# Patient Record
Sex: Female | Born: 1995 | Race: White | Hispanic: No | Marital: Single | Smoking: Never smoker
Health system: Southern US, Community
[De-identification: ages and names within clinical notes are randomized; demographics above are authoritative.]

## PROBLEM LIST (undated history)

## (undated) DIAGNOSIS — F32A Depression, unspecified: Secondary | ICD-10-CM

## (undated) DIAGNOSIS — F419 Anxiety disorder, unspecified: Secondary | ICD-10-CM

## (undated) DIAGNOSIS — K5190 Ulcerative colitis, unspecified, without complications: Secondary | ICD-10-CM

## (undated) DIAGNOSIS — Z975 Presence of (intrauterine) contraceptive device: Secondary | ICD-10-CM

## (undated) HISTORY — DX: Ulcerative colitis, unspecified, without complications: K51.90

## (undated) HISTORY — DX: Anxiety disorder, unspecified: F41.9

## (undated) HISTORY — DX: Presence of (intrauterine) contraceptive device: Z97.5

## (undated) HISTORY — DX: Depression, unspecified: F32.A

---
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
## 2013-06-02 DIAGNOSIS — K6289 Other specified diseases of anus and rectum: Secondary | ICD-10-CM | POA: Insufficient documentation

## 2020-02-07 ENCOUNTER — Other Ambulatory Visit (HOSPITAL_COMMUNITY): Payer: Self-pay

## 2020-04-15 ENCOUNTER — Ambulatory Visit: Payer: Self-pay | Admitting: Family Medicine

## 2020-04-15 ENCOUNTER — Other Ambulatory Visit: Payer: Self-pay

## 2020-04-15 ENCOUNTER — Ambulatory Visit (INDEPENDENT_AMBULATORY_CARE_PROVIDER_SITE_OTHER): Payer: No Typology Code available for payment source | Admitting: Family Medicine

## 2020-04-15 ENCOUNTER — Other Ambulatory Visit (HOSPITAL_COMMUNITY): Payer: Self-pay | Admitting: Family Medicine

## 2020-04-15 ENCOUNTER — Encounter: Payer: Self-pay | Admitting: Family Medicine

## 2020-04-15 VITALS — BP 110/80 | HR 88 | Temp 97.0°F | Ht 68.5 in | Wt 137.5 lb

## 2020-04-15 DIAGNOSIS — Z7189 Other specified counseling: Secondary | ICD-10-CM

## 2020-04-15 DIAGNOSIS — K5190 Ulcerative colitis, unspecified, without complications: Secondary | ICD-10-CM | POA: Diagnosis not present

## 2020-04-15 DIAGNOSIS — K51919 Ulcerative colitis, unspecified with unspecified complications: Secondary | ICD-10-CM

## 2020-04-15 DIAGNOSIS — F418 Other specified anxiety disorders: Secondary | ICD-10-CM

## 2020-04-15 MED ORDER — FLUOXETINE HCL 10 MG PO CAPS: 10.0000 mg | ORAL_CAPSULE | Freq: Every day | ORAL | 3 refills | Status: DC

## 2020-04-15 MED ORDER — MESALAMINE 1000 MG RE SUPP: RECTAL | 5 refills | Status: DC

## 2020-04-15 MED ORDER — FLUOXETINE HCL 20 MG PO TABS: 20.0000 mg | ORAL_TABLET | Freq: Every day | ORAL | 3 refills | Status: DC

## 2020-04-15 NOTE — Patient Instructions (Signed)
We'll request your records and call about seeing GI.  Take care.  Glad to see you. Thanks for your effort.

## 2020-04-15 NOTE — Progress Notes (Signed)
This visit occurred during the SARS-CoV-2 public health emergency.  Safety protocols were in place, including screening questions prior to the visit, additional usage of staff PPE, and extensive cleaning of exam room while observing appropriate contact time as indicated for disinfecting solutions.  New patient.    History of ulcerative colitis.  Had run out of mesalamine.  Last saw GI about 2 years ago.  Some night sweats, unclear if from SSRI or from UC.  She needed a refill on her prescriptions and need to get set back up with GI.  Referral placed.  Requesting old records.  She has had some fecal urgency and passing mucus recently.  Fortunately she is not having fevers.  She previously did better when she had her medication, meaning her symptoms were controlled on medication.  History of depression/anxiety.  She had been started on fluoxetine in the meantime and had clear improvement in her symptoms.  GAD of 9 and PHQ of 11 now.  Those are both significantly improved per her report.  Prior to starting treatment she had independently taken those assessments and had much higher scores.  No suicidal homicidal intent.  She very much wants to continue with Loxitane as it has been very beneficial.  Advance directive discussed with patient.  Both parents equally designated if patient were incapacitated.  She has history of copper T IUD placed.  Still with regular periods.  She had a Covid vaccine. She is up-to-date on tetanus and flu.  PMH and SH reviewed  ROS: Per HPI unless specifically indicated in ROS section   Meds, vitals, and allergies reviewed.   GEN: nad, alert and oriented HEENT: ncat NECK: supple w/o LA CV: rrr PULM: ctab, no inc wob ABD: soft, +bs EXT: no edema SKIN: no acute rash

## 2020-04-16 ENCOUNTER — Telehealth: Payer: Self-pay

## 2020-04-16 NOTE — Telephone Encounter (Signed)
Left message for patient to call back. Appointment scheduled to see Dr Elnoria Howard on 04/23/20 at 10:30 am.  [facility] [address] [place], [place] [address] [phone]

## 2020-04-17 ENCOUNTER — Encounter: Payer: Self-pay | Admitting: Family Medicine

## 2020-04-17 DIAGNOSIS — Z7189 Other specified counseling: Secondary | ICD-10-CM | POA: Insufficient documentation

## 2020-04-17 DIAGNOSIS — K5190 Ulcerative colitis, unspecified, without complications: Secondary | ICD-10-CM | POA: Insufficient documentation

## 2020-04-17 DIAGNOSIS — F418 Other specified anxiety disorders: Secondary | ICD-10-CM | POA: Insufficient documentation

## 2020-04-17 NOTE — Assessment & Plan Note (Signed)
Restart mesalamine suppository.  Prescription sent.  Refer back to GI.  Requesting old records.  She will update me if she is not improved in the meantime.

## 2020-04-17 NOTE — Assessment & Plan Note (Signed)
Advance directive discussed with patient.  Both parents equally designated if patient were incapacitated. 

## 2020-04-17 NOTE — Assessment & Plan Note (Signed)
No suicidal or homicidal intent.  She clearly improved on fluoxetine 30 mg a day.  Would continue 30 mg a day.  She will update me as needed.  She agrees with plan.

## 2020-04-18 NOTE — Telephone Encounter (Signed)
Left message for patient and her mom to have patient call me back about appointment

## 2020-04-22 NOTE — Telephone Encounter (Signed)
Called patient again and mailed letter. Called Dr Haywood Pao office and cancelled appointment for tomorrow so patient does not get a no show fee. Not sure if she is getting our messages. Mailed letter letting patient know that she can call their office to schedule directly when she is ready.

## 2020-04-24 ENCOUNTER — Telehealth: Payer: Self-pay

## 2020-04-24 MED ORDER — FLUOXETINE HCL 20 MG PO CAPS: ORAL_CAPSULE | ORAL | 3 refills | Status: DC

## 2020-04-24 NOTE — Telephone Encounter (Signed)
[facility] left v/m that on 04/15/20 fluoxetine 20 mg tabs # 90 x 3 was sent to [facility]. The co pay for fluoxetine 20 mg tabs for 90 day supply is $45.00 and the copay for fluoxetine 20 mg caps for 90 days is $15.00. pt was previously on caps. [facility] request new rx sent to pharmacy if approved by provider. Please advise.

## 2020-04-24 NOTE — Telephone Encounter (Signed)
rx updated to capsules and sent.  Thanks .

## 2020-08-12 ENCOUNTER — Emergency Department (HOSPITAL_COMMUNITY): Payer: No Typology Code available for payment source

## 2020-08-12 ENCOUNTER — Emergency Department (HOSPITAL_COMMUNITY)
Admission: EM | Admit: 2020-08-12 | Discharge: 2020-08-12 | Disposition: A | Discharge status: Home / Self Care | Payer: No Typology Code available for payment source | Attending: Emergency Medicine | Admitting: Emergency Medicine

## 2020-08-12 ENCOUNTER — Other Ambulatory Visit: Payer: Self-pay

## 2020-08-12 ENCOUNTER — Encounter (HOSPITAL_COMMUNITY): Payer: Self-pay | Admitting: Emergency Medicine

## 2020-08-12 DIAGNOSIS — R059 Cough, unspecified: Secondary | ICD-10-CM | POA: Diagnosis present

## 2020-08-12 DIAGNOSIS — J40 Bronchitis, not specified as acute or chronic: Secondary | ICD-10-CM | POA: Diagnosis not present

## 2020-08-12 MED ORDER — DOXYCYCLINE HYCLATE 100 MG PO TABS
100.0000 mg | ORAL_TABLET | Freq: Once | ORAL | Status: AC
  Administered 2020-08-12: 100 mg via ORAL
  Filled 2020-08-12: qty 1

## 2020-08-12 MED ORDER — DOXYCYCLINE HYCLATE 100 MG PO CAPS: 100.0000 mg | ORAL_CAPSULE | Freq: Two times a day (BID) | ORAL | 0 refills | Status: DC

## 2020-08-12 NOTE — ED Triage Notes (Signed)
Patient presents with cough, fatigue, sore throat. Intermittent fevers a few days ago. Productive cough with green sputum.

## 2020-08-12 NOTE — ED Provider Notes (Addendum)
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  She has been sick with URI symptoms for the past 9 days.  Specifically she has had nasal congestion, fevers, scratchy throat, general malaise, and productive cough.  She describes her productive cough has been productive of thick balls of mucus.  She denies being short of breath.  She has not had nausea, vomiting or diarrhea.  She has not had abdominal pain.  Symptoms are moderate.  She has been taking over-the-counter medications without adequate relief.  She tested negative for COVID 4 days ago.   Past Medical History:  Diagnosis Date  . Anxiety   . Depression   . IUD (intrauterine device) in place    Copper T  . Ulcerative colitis (HCC)     History reviewed. No pertinent surgical history.  Family History  Problem Relation Age of Onset  . Depression Father   . High blood pressure Father   . Breast cancer Paternal Grandmother   . Ulcerative colitis Neg Hx   . Colon cancer Neg Hx     Social History   Tobacco Use  . Smoking status: Never Smoker  . Smokeless tobacco: Never Used  Substance Use Topics  . Alcohol use: Yes    Comment: Rare alcohol use.  . Drug use: Never    Prior to Admission medications   Medication Sig Start Date End Date Taking? Authorizing Provider  doxycycline (VIBRAMYCIN) 100 MG capsule Take 1 capsule (100 mg total) by mouth 2 (two) times daily. One po bid x 7 days 08/12/20  Yes , , MD  FLUoxetine (PROZAC) 10 MG capsule Take 1 capsule (10 mg total) by mouth daily. With 20mg  for total 30mg  a day. 04/15/20   , MD  FLUoxetine (PROZAC) 10 MG capsule TAKE 3 CAPSULES BY MOUTH ONCE DAILY AS DIRECTED 02/07/20 02/06/21    FLUoxetine (PROZAC) 20 MG capsule Take 1  capsule (20 mg total) by mouth daily. With 10mg  for total 30mg  a day. 04/24/20   02/08/21, MD  mesalamine (CANASA) 1000 MG suppository UNWRAP AND INSERT ONE SUPPOSITORY RECTALLY AT BEDTIME AS DIRECTED 04/15/20   , MD  mesalamine (CANASA) 1000 MG suppository UNWRAP AND INSERT ONE SUPPOSITORY RECTALLY AT BEDTIME AS DIRECTED 04/15/20 04/15/21  04/17/20, MD    Allergies Patient has no known allergies.   REVIEW OF SYSTEMS  Negative except as noted here or in the History of Present Illness.   PHYSICAL EXAMINATION  Initial Vital Signs Blood pressure 122/88, pulse 95, temperature 98.7 F (37.1 C), temperature source Oral, resp. rate 20, height 5' 8.5" (1.74 m), weight 59 kg, last menstrual period 08/11/2020, SpO2 99 %.  Examination General: Well-developed, well-nourished female in no acute distress; appearance consistent with age of record HENT: normocephalic; atraumatic; dysphonia; no pharyngeal erythema or exudate Eyes: pupils equal, round and reactive to light; extraocular muscles intact Neck: supple Heart: regular rate and rhythm Lungs: clear to auscultation bilaterally Abdomen: soft; nondistended; nontender; no masses or hepatosplenomegaly; bowel sounds present Extremities: No deformity; full range of motion Neurologic: Awake, alert and oriented; motor function intact in all extremities and symmetric; no facial droop Skin: Warm and dry Psychiatric: Normal mood and affect   RESULTS  Summary of  this visit's results, reviewed and interpreted by myself:   EKG Interpretation  Date/Time:    Ventricular Rate:    PR Interval:    QRS Duration:   QT Interval:    QTC Calculation:   R Axis:     Text Interpretation:        Laboratory Studies: No results found for this or any previous visit (from the past 24 hour(s)). Imaging Studies: DG Chest 2 View  Result Date: 08/12/2020 CLINICAL DATA:  Two weeks of cough and intermittent fevers EXAM: CHEST - 2  VIEW COMPARISON:  None. FINDINGS: No consolidation, features of edema, pneumothorax, or effusion. Pulmonary vascularity is normally distributed. The cardiomediastinal contours are unremarkable. No acute osseous or soft tissue abnormality. IMPRESSION: No acute cardiopulmonary abnormality. Electronically Signed   By: Kreg Shropshire M.D.   On: 08/12/2020 06:00    ED COURSE and MDM  Nursing notes, initial and subsequent vitals signs, including pulse oximetry, reviewed and interpreted by myself.  Vitals:   08/12/20 0543 08/12/20 0544  BP:  122/88  Pulse:  95  Resp:  20  Temp:  98.7 F (37.1 C)  TempSrc:  Oral  SpO2:  99%  Weight: 59 kg   Height: 5' 8.5" (1.74 m)    Medications  doxycycline (VIBRA-TABS) tablet 100 mg (has no administration in time range)    Patient's chest x-ray shows no pneumonia but given the extended length of her illness I think it is reasonable to place her on a course of doxycycline.  She is on mesalamine for her ulcerative colitis but no immunosuppressive drugs.  Her lungs are clear and she does not wish an albuterol inhaler.  PROCEDURES  Procedures   ED DIAGNOSES     ICD-10-CM   1. Bronchitis  J40        , , MD 08/12/20 9937    Paula Libra, MD 08/12/20 [phone]

## 2020-11-10 ENCOUNTER — Other Ambulatory Visit (HOSPITAL_COMMUNITY): Payer: Self-pay

## 2020-11-10 MED FILL — Fluoxetine HCl Cap 10 MG: ORAL | 90 days supply | Qty: 270 | Fill #0 | Status: AC

## 2020-11-11 ENCOUNTER — Other Ambulatory Visit (HOSPITAL_COMMUNITY): Payer: Self-pay

## 2021-02-04 ENCOUNTER — Other Ambulatory Visit: Payer: Self-pay | Admitting: Family Medicine

## 2021-02-04 ENCOUNTER — Other Ambulatory Visit (HOSPITAL_COMMUNITY): Payer: Self-pay

## 2021-02-05 ENCOUNTER — Other Ambulatory Visit (HOSPITAL_COMMUNITY): Payer: Self-pay

## 2021-02-10 ENCOUNTER — Encounter: Payer: Self-pay | Admitting: Family Medicine

## 2021-02-10 ENCOUNTER — Other Ambulatory Visit: Payer: Self-pay

## 2021-02-10 ENCOUNTER — Other Ambulatory Visit (HOSPITAL_COMMUNITY): Payer: Self-pay

## 2021-02-10 ENCOUNTER — Ambulatory Visit (INDEPENDENT_AMBULATORY_CARE_PROVIDER_SITE_OTHER): Payer: No Typology Code available for payment source | Admitting: Family Medicine

## 2021-02-10 VITALS — BP 106/80 | HR 73 | Temp 98.2°F | Ht 68.5 in | Wt 145.0 lb

## 2021-02-10 DIAGNOSIS — F418 Other specified anxiety disorders: Secondary | ICD-10-CM

## 2021-02-10 DIAGNOSIS — Z23 Encounter for immunization: Secondary | ICD-10-CM

## 2021-02-10 DIAGNOSIS — K51919 Ulcerative colitis, unspecified with unspecified complications: Secondary | ICD-10-CM | POA: Diagnosis not present

## 2021-02-10 DIAGNOSIS — L237 Allergic contact dermatitis due to plants, except food: Secondary | ICD-10-CM

## 2021-02-10 MED ORDER — TRIAMCINOLONE ACETONIDE 0.5 % EX CREA
1.0000 "application " | TOPICAL_CREAM | Freq: Two times a day (BID) | CUTANEOUS | 1 refills | Status: DC | PRN
  Filled 2021-02-10: qty 30, 15d supply, fill #0

## 2021-02-10 MED ORDER — FLUOXETINE HCL 10 MG PO CAPS
ORAL_CAPSULE | ORAL | 3 refills | Status: DC
  Filled 2021-02-10: qty 270, 90d supply, fill #0
  Filled 2021-05-17: qty 270, 90d supply, fill #1
  Filled 2021-08-11: qty 270, 90d supply, fill #2
  Filled 2021-11-13: qty 270, 90d supply, fill #3

## 2021-02-10 MED ORDER — PREDNISONE 10 MG PO TABS: ORAL_TABLET | ORAL | 0 refills | Status: DC

## 2021-02-10 NOTE — Progress Notes (Signed)
This visit occurred during the SARS-CoV-2 public health emergency.  Safety protocols were in place, including screening questions prior to the visit, additional usage of staff PPE, and extensive cleaning of exam room while observing appropriate contact time as indicated for disinfecting solutions.  Mood f/u.  Mood is good, compliant with prozac, "it's like night and day."  It is worth continuing per patient report.  No SI/HI.  She has gone hiking for fun.  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  She will having weekends off after the job change.    Flu shot today.   D/w pt about referral to GI.  Referral placed.  Rare use of mesalamine.  No sx.  No blood in stool.    Rash.  Likely poison ivy exposure.  Itchy rash R forearm, present for about 1 weeks.  Used hydrocortisone.    Meds, vitals, and allergies reviewed.   ROS: Per HPI unless specifically indicated in ROS section   GEN: nad, alert and oriented HEENT: ncat NECK: supple w/o LA CV: rrr. PULM: ctab, no inc wob ABD: soft, +bs EXT: no edema SKIN: Blanching well demarcated pinkish rash noted on the right forearm in nondermatomal distribution.

## 2021-02-10 NOTE — Patient Instructions (Signed)
Use TAC cream if needed.  Then use prednisone if no relief.  Update me as needed.  We'll call about seeing GI.  Take care.  Glad to see you. Flu shot today.

## 2021-02-11 ENCOUNTER — Other Ambulatory Visit (HOSPITAL_COMMUNITY): Payer: Self-pay

## 2021-02-14 DIAGNOSIS — L237 Allergic contact dermatitis due to plants, except food: Secondary | ICD-10-CM | POA: Insufficient documentation

## 2021-02-14 NOTE — Assessment & Plan Note (Signed)
Refer to GI 

## 2021-02-14 NOTE — Assessment & Plan Note (Signed)
Mood is good, compliant with prozac, "it's like night and day."  It is worth continuing per patient report.  No SI/HI.  She has gone hiking for fun.  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  She will having weekends off after the job change.  Continue Prozac and she will update me as needed.

## 2021-02-14 NOTE — Assessment & Plan Note (Signed)
Hold prednisone for now.  Use triamcinolone cream initially and then use prednisone if needed.  Routine steroid cautions given to patient.

## 2021-03-03 ENCOUNTER — Other Ambulatory Visit (HOSPITAL_COMMUNITY): Payer: Self-pay

## 2021-03-03 MED ORDER — CARESTART COVID-19 HOME TEST VI KIT
PACK | 0 refills | Status: DC
  Filled 2021-03-03: qty 4, 4d supply, fill #0

## 2021-03-10 ENCOUNTER — Telehealth: Payer: Self-pay | Admitting: Family Medicine

## 2021-03-10 NOTE — Telephone Encounter (Signed)
Immunization record has been printed and emailed as requested. I called patient but there was no answer and VM not set up.

## 2021-03-10 NOTE — Telephone Encounter (Signed)
Pt called in stated she need's proof that she had her Flu shot ,wants to know if it can be emailed  [email or web address] . Please advise

## 2021-03-11 ENCOUNTER — Telehealth: Payer: Self-pay | Admitting: Family Medicine

## 2021-03-11 NOTE — Telephone Encounter (Signed)
Tried to call patient to verify if she received the email and if needs anything else but no answer and VM not set up.

## 2021-03-11 NOTE — Telephone Encounter (Signed)
Pt called asking for a scan of her flu shot and the date she got it and post it on her MyChart.

## 2021-05-17 ENCOUNTER — Other Ambulatory Visit (HOSPITAL_COMMUNITY): Payer: Self-pay

## 2021-08-12 ENCOUNTER — Other Ambulatory Visit (HOSPITAL_COMMUNITY): Payer: Self-pay

## 2021-11-13 ENCOUNTER — Other Ambulatory Visit (HOSPITAL_COMMUNITY): Payer: Self-pay

## 2022-01-05 ENCOUNTER — Telehealth: Payer: Self-pay | Admitting: Family Medicine

## 2022-01-05 ENCOUNTER — Other Ambulatory Visit (HOSPITAL_COMMUNITY): Payer: Self-pay

## 2022-01-05 MED ORDER — FLUOXETINE HCL 10 MG PO CAPS
30.0000 mg | ORAL_CAPSULE | Freq: Every day | ORAL | 0 refills | Status: DC
  Filled 2022-01-05: qty 270, 90d supply, fill #0

## 2022-01-05 NOTE — Telephone Encounter (Signed)
  Encourage patient to contact the pharmacy for refills or they can request refills through [facility]  Did the patient contact the pharmacy:  no   LAST APPOINTMENT DATE:  Please schedule appointment if longer than 1 year  NEXT APPOINTMENT DATE:10/172022  MEDICATION:FLUoxetine (PROZAC) 10 MG capsule  Is the patient out of medication? no   If not, how much is left?several weeks  Is this a 90 day supply: yes  PHARMACY: [facility] Phone:  [phone]  Fax:  [phone]      Let patient know to contact pharmacy at the end of the day to make sure medication is ready.  Please notify patient to allow 48-72 hours to process

## 2022-01-05 NOTE — Telephone Encounter (Signed)
Last refill 02/10/21 #270/3 Last office visit 02/10/21 No upcoming appointment scheduled

## 2022-01-05 NOTE — Telephone Encounter (Signed)
Sent. Thanks.   

## 2022-02-08 ENCOUNTER — Other Ambulatory Visit: Payer: Self-pay | Admitting: Family Medicine

## 2022-02-08 DIAGNOSIS — K51919 Ulcerative colitis, unspecified with unspecified complications: Secondary | ICD-10-CM

## 2022-02-09 ENCOUNTER — Other Ambulatory Visit: Payer: No Typology Code available for payment source

## 2022-02-10 ENCOUNTER — Other Ambulatory Visit (INDEPENDENT_AMBULATORY_CARE_PROVIDER_SITE_OTHER): Payer: No Typology Code available for payment source

## 2022-02-10 DIAGNOSIS — K51919 Ulcerative colitis, unspecified with unspecified complications: Secondary | ICD-10-CM

## 2022-02-10 LAB — COMPREHENSIVE METABOLIC PANEL
ALT: 8 U/L (ref 0–35)
AST: 12 U/L (ref 0–37)
Albumin: 4.3 g/dL (ref 3.5–5.2)
Alkaline Phosphatase: 45 U/L (ref 39–117)
BUN: 11 mg/dL (ref 6–23)
CO2: 29 mEq/L (ref 19–32)
Calcium: 8.8 mg/dL (ref 8.4–10.5)
Chloride: 103 mEq/L (ref 96–112)
Creatinine, Ser: 0.71 mg/dL (ref 0.40–1.20)
GFR: 115.94 mL/min (ref >60.00)
Glucose, Bld: 83 mg/dL (ref 70–99)
Potassium: 4.1 mEq/L (ref 3.5–5.1)
Sodium: 138 mEq/L (ref 135–145)
Total Bilirubin: 0.3 mg/dL (ref 0.2–1.2)
Total Protein: 7.1 g/dL (ref 6.0–8.3)

## 2022-02-10 LAB — CBC WITH DIFFERENTIAL/PLATELET
Basophils Absolute: 0.1 10*3/uL (ref 0.0–0.1)
Basophils Relative: 1.1 % (ref 0.0–3.0)
Eosinophils Absolute: 0.2 10*3/uL (ref 0.0–0.7)
Eosinophils Relative: 3.4 % (ref 0.0–5.0)
HCT: 36.6 % (ref 36.0–46.0)
Hemoglobin: 12.2 g/dL (ref 12.0–15.0)
Lymphocytes Relative: 39.1 % (ref 12.0–46.0)
Lymphs Abs: 2.5 10*3/uL (ref 0.7–4.0)
MCHC: 33.2 g/dL (ref 30.0–36.0)
MCV: 89.5 fl (ref 78.0–100.0)
Monocytes Absolute: 0.6 10*3/uL (ref 0.1–1.0)
Monocytes Relative: 8.9 % (ref 3.0–12.0)
Neutro Abs: 3.1 10*3/uL (ref 1.4–7.7)
Neutrophils Relative %: 47.5 % (ref 43.0–77.0)
Platelets: 325 10*3/uL (ref 150.0–400.0)
RBC: 4.09 Mil/uL (ref 3.87–5.11)
RDW: 13.6 % (ref 11.5–15.5)
WBC: 6.4 10*3/uL (ref 4.0–10.5)

## 2022-02-16 ENCOUNTER — Encounter: Payer: Self-pay | Admitting: Family Medicine

## 2022-02-16 ENCOUNTER — Ambulatory Visit (INDEPENDENT_AMBULATORY_CARE_PROVIDER_SITE_OTHER): Payer: No Typology Code available for payment source | Admitting: Family Medicine

## 2022-02-16 ENCOUNTER — Other Ambulatory Visit (HOSPITAL_COMMUNITY): Payer: Self-pay

## 2022-02-16 VITALS — BP 122/80 | HR 91 | Temp 97.8°F | Ht 68.5 in | Wt 153.0 lb

## 2022-02-16 DIAGNOSIS — Z975 Presence of (intrauterine) contraceptive device: Secondary | ICD-10-CM | POA: Diagnosis not present

## 2022-02-16 DIAGNOSIS — R61 Generalized hyperhidrosis: Secondary | ICD-10-CM

## 2022-02-16 DIAGNOSIS — Z0000 Encounter for general adult medical examination without abnormal findings: Secondary | ICD-10-CM | POA: Diagnosis not present

## 2022-02-16 DIAGNOSIS — R221 Localized swelling, mass and lump, neck: Secondary | ICD-10-CM

## 2022-02-16 DIAGNOSIS — K5190 Ulcerative colitis, unspecified, without complications: Secondary | ICD-10-CM

## 2022-02-16 DIAGNOSIS — F418 Other specified anxiety disorders: Secondary | ICD-10-CM

## 2022-02-16 DIAGNOSIS — Z7189 Other specified counseling: Secondary | ICD-10-CM

## 2022-02-16 LAB — TSH: TSH: 2.14 u[IU]/mL (ref 0.35–5.50)

## 2022-02-16 LAB — SEDIMENTATION RATE: Sed Rate: 19 mm/hr (ref 0–20)

## 2022-02-16 MED ORDER — FLUOXETINE HCL 10 MG PO CAPS
30.0000 mg | ORAL_CAPSULE | Freq: Every day | ORAL | 3 refills | Status: DC
  Filled 2022-02-16: qty 270, 90d supply, fill #0
  Filled 2022-05-28 – 2022-06-09 (×2): qty 270, 90d supply, fill #1
  Filled 2022-09-27: qty 270, 90d supply, fill #2
  Filled 2022-12-14: qty 270, 90d supply, fill #3

## 2022-02-16 NOTE — Progress Notes (Unsigned)
CPE- See plan.  Routine anticipatory guidance given to patient.  See health maintenance.  The possibility exists that previously documented standard health maintenance information may have been brought forward from a previous encounter into this note.  If needed, that same information has been updated to reflect the current situation based on today's encounter.    Tdap 21 Covid prev done.  Flu shot to be done at work.  PNA and shingles not due.  Refer to gyn 2023 Colon cancer screening not due.   Mammogram not due.  Advance directive discussed with patient.  Both parents equally designated if patient were incapacitated. [facility].  Would have HIV and HCV screening done.    Night sweats.  Not every night but a few nights a week.  Going on for about several years.  She has been putting up with it.  She isn't losing weight.  No cough, no sputum.  She is waking at night, sometimes from sweats but sometimes w/o sweats.  She can get back to sleep well.    Knot behind L jaw for a few years.  Noted in college.  Not ttp.  No other lumps.  Inferior to L ear.    She has IUD and wants to see gyn, d/w pt.  Referral placed.    Mood d/w pt.  Mostly good but some irritability.  No SI/HI.  Compliant with fluoxetine, it had helped in the past with depression.  She keeps snacks on hand to prevent prolonged fasting.   GI sx d/w pt. No FCNAV.  No blood in stool.  No diarrhea.  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    Working at the [place] center.  I thanked her for her effort   PMH and SH reviewed  Meds, vitals, and allergies reviewed.   ROS: Per HPI.  Unless specifically indicated otherwise in HPI, the patient denies:  General: fever. Eyes: acute vision changes ENT: sore throat Cardiovascular: chest pain Respiratory: SOB GI: vomiting GU: dysuria Musculoskeletal: acute back pain Derm: acute rash Neuro: acute motor dysfunction Psych: worsening mood Endocrine:  polydipsia Heme: bleeding Allergy: hayfever  GEN: nad, alert and oriented HEENT: mucous membranes moist NECK: supple w/o LA except for small asymmetry inferior to the L ear and anterior to the mastoid.  Not ttp.  No bruising or redness.  No other LA.   CV: rrr. PULM: ctab, no inc wob ABD: soft, +bs EXT: no edema SKIN: no acute rash

## 2022-02-16 NOTE — Patient Instructions (Addendum)
If you don't get a call about the referral in a week, then let me know.   Go to the lab on the way out.   If you have mychart we'll likely use that to update you.    Let me see about getting the ultrasound set up and we'll go from there.    Take care.  Glad to see you.

## 2022-02-18 ENCOUNTER — Other Ambulatory Visit: Payer: Self-pay | Admitting: Family Medicine

## 2022-02-18 DIAGNOSIS — R221 Localized swelling, mass and lump, neck: Secondary | ICD-10-CM | POA: Insufficient documentation

## 2022-02-18 DIAGNOSIS — Z0000 Encounter for general adult medical examination without abnormal findings: Secondary | ICD-10-CM | POA: Insufficient documentation

## 2022-02-18 DIAGNOSIS — R61 Generalized hyperhidrosis: Secondary | ICD-10-CM | POA: Insufficient documentation

## 2022-02-18 NOTE — Assessment & Plan Note (Signed)
Advance directive discussed with patient.  Both parents equally designated if patient were incapacitated.

## 2022-02-18 NOTE — Assessment & Plan Note (Signed)
Present for year, d/w pt about checking u/s.  Ordered.

## 2022-02-18 NOTE — Assessment & Plan Note (Signed)
Of unclear source, d/w pt about checking routine labs.  Unremarkable exam and no obvious cause.

## 2022-02-18 NOTE — Assessment & Plan Note (Signed)
Tdap 21 Covid prev done.  Flu shot to be done at work.  PNA and shingles not due.  Refer to gyn 2023 Colon cancer screening not due.   Mammogram not due.  Advance directive discussed with patient.  Both parents equally designated if patient were incapacitated. [facility]  Would have HIV and HCV screening done.

## 2022-02-18 NOTE — Assessment & Plan Note (Signed)
Would continue prozac and update me as needed.  She agrees.

## 2022-02-18 NOTE — Assessment & Plan Note (Signed)
Echo controlled as is, off treatment.

## 2022-02-21 LAB — QUANTIFERON-TB GOLD PLUS
Mitogen-NIL: 8.31 IU/mL
NIL: 0.02 IU/mL
QuantiFERON-TB Gold Plus: NEGATIVE
TB1-NIL: 0 IU/mL
TB2-NIL: 0 IU/mL

## 2022-03-22 IMAGING — CR DG CHEST 2V
2 series · 2 of 2 positions shown · non-contrast
Comparison: None.

CLINICAL DATA: Two weeks of cough and intermittent fevers

EXAM:
CHEST - 2 VIEW

[w chest pa]
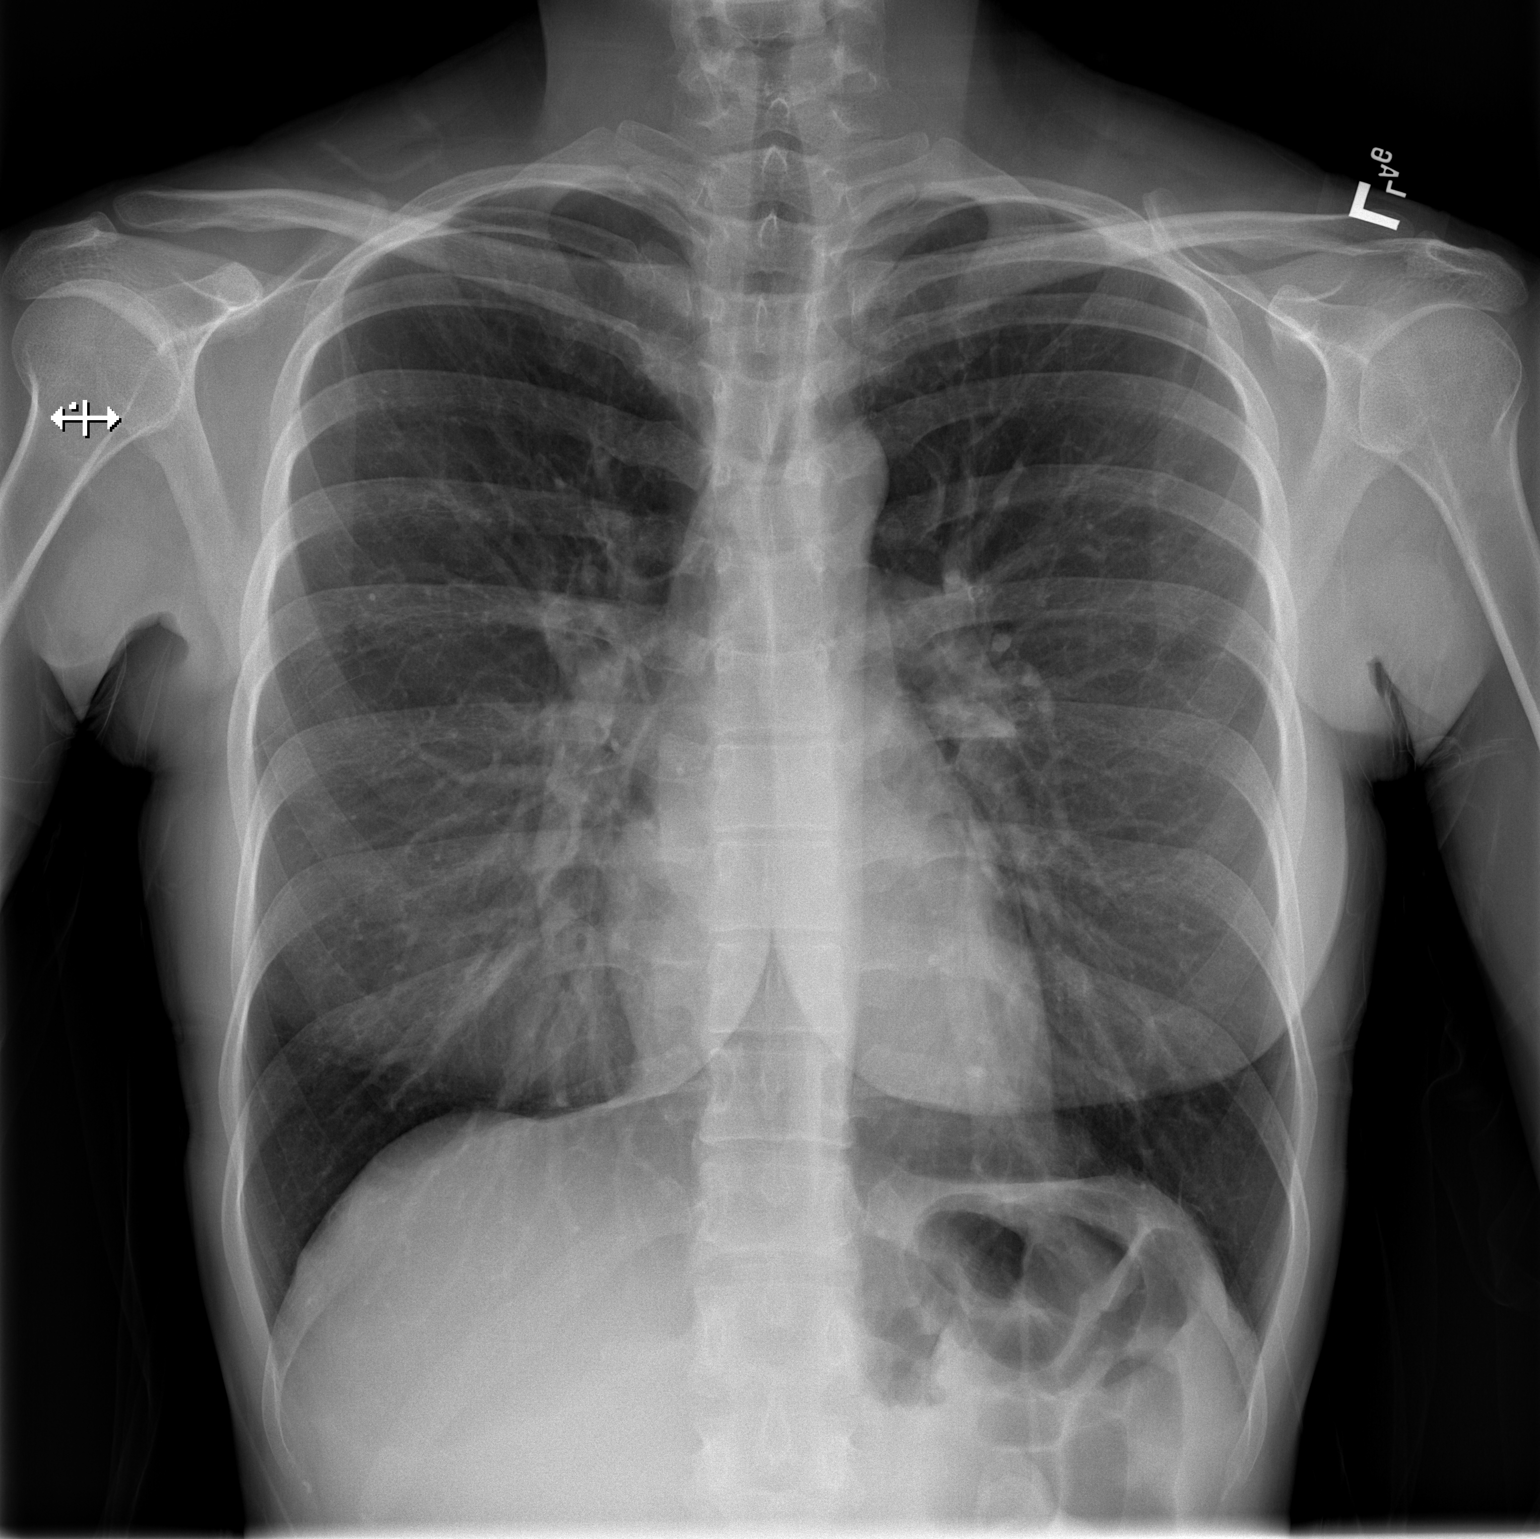

[w chest lat]
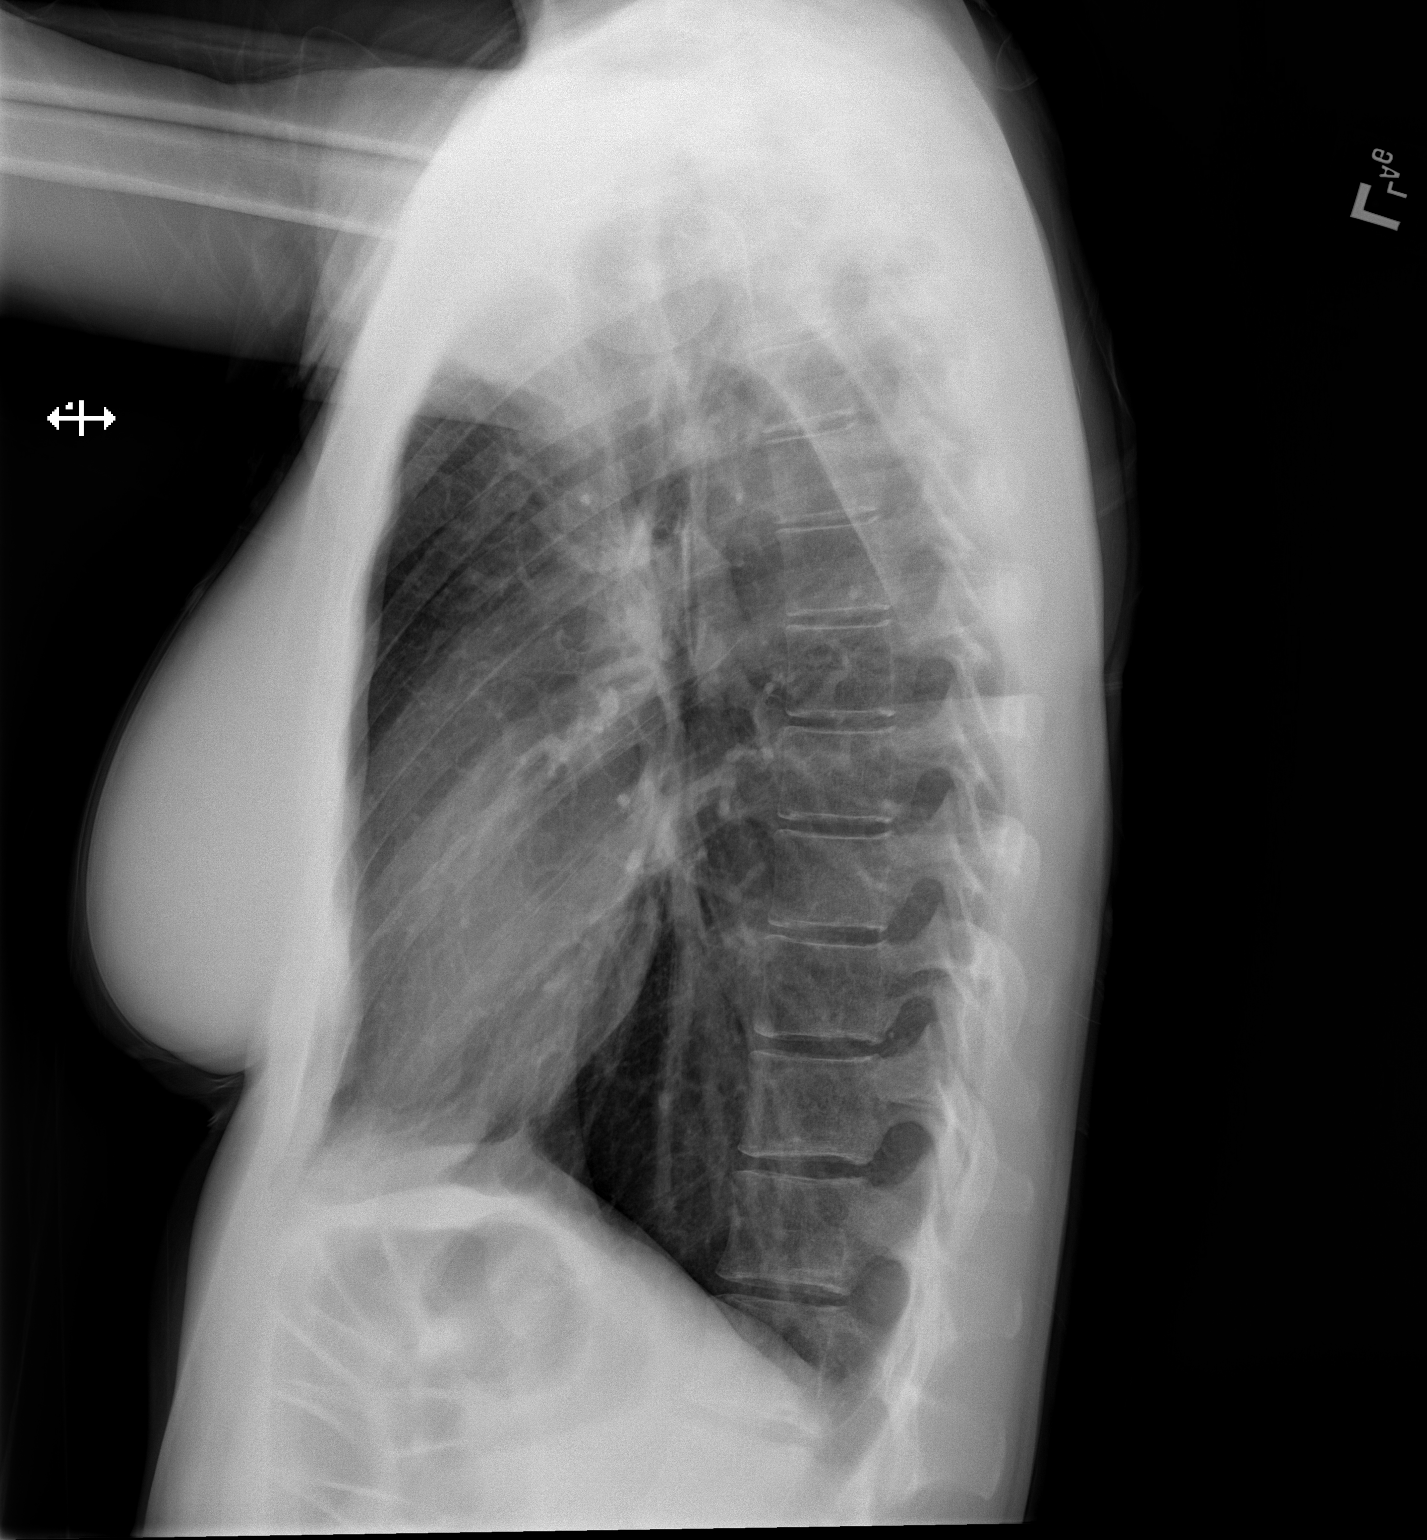

[2 of 2 positions shown; findings below may reference images not displayed]

FINDINGS: No consolidation, features of edema, pneumothorax, or effusion.
Pulmonary vascularity is normally distributed. The cardiomediastinal
contours are unremarkable. No acute osseous or soft tissue
abnormality.
IMPRESSION: No acute cardiopulmonary abnormality.

## 2022-03-26 ENCOUNTER — Ambulatory Visit
Admission: RE | Admit: 2022-03-26 | Discharge: 2022-03-26 | Disposition: A | Discharge status: Home / Self Care | Payer: No Typology Code available for payment source | Source: Ambulatory Visit | Attending: Family Medicine | Admitting: Family Medicine

## 2022-03-26 DIAGNOSIS — R221 Localized swelling, mass and lump, neck: Secondary | ICD-10-CM

## 2022-05-28 ENCOUNTER — Telehealth: Payer: 59 | Admitting: Emergency Medicine

## 2022-05-28 ENCOUNTER — Other Ambulatory Visit (HOSPITAL_COMMUNITY): Payer: Self-pay

## 2022-05-28 DIAGNOSIS — L237 Allergic contact dermatitis due to plants, except food: Secondary | ICD-10-CM | POA: Diagnosis not present

## 2022-05-28 MED ORDER — TRIAMCINOLONE ACETONIDE 0.1 % EX CREA
1.0000 | TOPICAL_CREAM | Freq: Two times a day (BID) | CUTANEOUS | 0 refills | Status: DC
  Filled 2022-05-28: qty 30, 15d supply, fill #0

## 2022-05-28 MED ORDER — PREDNISONE 10 MG PO TABS
ORAL_TABLET | ORAL | 0 refills | Status: DC
  Filled 2022-05-28: qty 37, 14d supply, fill #0

## 2022-05-28 NOTE — Progress Notes (Signed)
E-Visit for Poison Ivy  We are sorry that you are not feeing well.  Here is how we plan to help!  Based on what you have shared with me it looks like you have had an allergic reaction to the oily resin from a group of plants.  This resin is very sticky, so it easily attaches to your skin, clothing, tools equipment, and pet's fur.    This blistering rash is often called poison ivy rash although it can come from contact with the leaves, stems and roots of poison ivy, poison oak and poison sumac.  The oily resin contains urushiol (u-ROO-she-ol) that produces a skin rash on exposed skin.  The severity of the rash depends on the amount of urushiol that gets on your skin.  A section of skin with more urushiol on it may develop a rash sooner.  The rash usually develops 12-48 hours after exposure and can last two to three weeks.  Your skin must come in direct contact with the plant's oil to be affected.  Blister fluid doesn't spread the rash.  However, if you come into contact with a piece of clothing or pet fur that has urushiol on it, the rash may spread out.  You can also transfer the oil to other parts of your body with your fingers.  Often the rash looks like a straight line because of the way the plant brushes against your skin.  Since your rash is widespread or has resulted in a large number of blisters, I have prescribed an oral corticosteroid.  Please follow these recommendations:  I have sent a prednisone dose pack to your chosen pharmacy. Be sure to follow the instructions carefully and complete the entire prescription. You may use Benadryl or Caladryl topical lotions to sooth the itch and remember cool, not hot, showers and baths can help relieve the itching!  Place cool, wet compresses on the affected area for 15-30 minutes several times a day.  You may also take oral antihistamines, such as diphenhydramine (Benadryl, others), which may also help you sleep better.  Watch your skin for any purulent  (pus) drainage or red streaking from the site.  If this occurs, contact your provider.  You may require an antibiotic for a skin infection.  Make sure that the clothes you were wearing as well as any towels or sheets that may have come in contact with the oil (urushiol) are washed in detergent and hot water.       I have developed the following plan to treat your condition I am prescribing a two week course of steroids (37 tablets of 10 mg prednisone).  Days 1-4 take 4 tablets (40 mg) daily  Days 5-8 take 3 tablets (30 mg) daily, Days 9-11 take 2 tablets (20 mg) daily, Days 12-14 take 1 tablet (10 mg) daily.    What can you do to prevent this rash?  Avoid the plants.  Learn how to identify poison ivy, poison oak and poison sumac in all seasons.  When hiking or engaging in other activities that might expose you to these plants, try to stay on cleared pathways.  If camping, make sure you pitch your tent in an area free of these plants.  Keep pets from running through wooded areas so that urushiol doesn't accidentally stick to their fur, which you may touch.  Remove or kill the plants.  In your yard, you can get rid of poison ivy by applying an herbicide or pulling it out of   the ground, including the roots, while wearing heavy gloves.  Afterward remove the gloves and thoroughly wash them and your hands.  Don't burn poison ivy or related plants because the urushiol can be carried by smoke.  Wear protective clothing.  If needed, protect your skin by wearing socks, boots, pants, long sleeves and vinyl gloves.  Wash your skin right away.  Washing off the oil with soap and water within 30 minutes of exposure may reduce your chances of getting a poison ivy rash.  Even washing after an hour or so can help reduce the severity of the rash.  If you walk through some poison ivy and then later touch your shoes, you may get some urushiol on your hands, which may then transfer to your face or body by touching or  rubbing.  If the contaminated object isn't cleaned, the urushiol on it can still cause a skin reaction years later.    Be careful not to reuse towels after you have washed your skin.  Also carefully wash clothing in detergent and hot water to remove all traces of the oil.  Handle contaminated clothing carefully so you don't transfer the urushiol to yourself, furniture, rugs or appliances.  Remember that pets can carry the oil on their fur and paws.  If you think your pet may be contaminated with urushiol, put on some long rubber gloves and give your pet a bath.  Finally, be careful not to burn these plants as the smoke can contain traces of the oil.  Inhaling the smoke may result in difficulty breathing. If that occurred you should see a physician as soon as possible.  See your doctor right away if:  The reaction is severe or widespread You inhaled the smoke from burning poison ivy and are having difficulty breathing Your skin continues to swell The rash affects your eyes, mouth or genitals Blisters are oozing pus You develop a fever greater than 100 F (37.8 C) The rash doesn't get better within a few weeks.  If you scratch the poison ivy rash, bacteria under your fingernails may cause the skin to become infected.  See your doctor if pus starts oozing from the blisters.  Treatment generally includes antibiotics.  Poison ivy treatments are usually limited to self-care methods.  And the rash typically goes away on its own in two to three weeks.     If the rash is widespread or results in a large number of blisters, your doctor may prescribe an oral corticosteroid, such as prednisone.  If a bacterial infection has developed at the rash site, your doctor may give you a prescription for an oral antibiotic.  MAKE SURE YOU  Understand these instructions. Will watch your condition. Will get help right away if you are not doing well or get worse.   Thank you for choosing an e-visit.  Your  e-visit answers were reviewed by a board certified advanced clinical practitioner to complete your personal care plan. Depending upon the condition, your plan could have included both over the counter or prescription medications.  Please review your pharmacy choice. Make sure the pharmacy is open so you can pick up prescription now. If there is a problem, you may contact your provider through MyChart messaging and have the prescription routed to another pharmacy.  Your safety is important to us. If you have drug allergies check your prescription carefully.   For the next 24 hours you can use MyChart to ask questions about today's visit, request a non-urgent   call back, or ask for a work or school excuse. You will get an email in the next two days asking about your experience. I hope that your e-visit has been valuable and will speed your recovery.    I have spent 5 minutes in review of e-visit questionnaire, review and updating patient chart, medical decision making and response to patient.    , PhD, FNP-BC   

## 2022-06-05 ENCOUNTER — Other Ambulatory Visit (HOSPITAL_COMMUNITY): Payer: Self-pay

## 2022-06-09 ENCOUNTER — Other Ambulatory Visit (HOSPITAL_COMMUNITY): Payer: Self-pay

## 2022-10-02 ENCOUNTER — Encounter: Payer: Self-pay | Admitting: Family Medicine

## 2022-10-04 ENCOUNTER — Telehealth: Payer: Self-pay | Admitting: Family Medicine

## 2022-10-04 ENCOUNTER — Other Ambulatory Visit: Payer: Self-pay | Admitting: Family Medicine

## 2022-10-04 DIAGNOSIS — K5190 Ulcerative colitis, unspecified, without complications: Secondary | ICD-10-CM

## 2022-10-04 DIAGNOSIS — K51919 Ulcerative colitis, unspecified with unspecified complications: Secondary | ICD-10-CM

## 2022-10-04 NOTE — Telephone Encounter (Signed)
Please triage patient about her GI symptoms.  Thanks.

## 2022-10-05 ENCOUNTER — Other Ambulatory Visit: Payer: Self-pay | Admitting: Family Medicine

## 2022-10-05 ENCOUNTER — Encounter (HOSPITAL_COMMUNITY): Payer: Self-pay | Admitting: Radiology

## 2022-10-05 DIAGNOSIS — K51919 Ulcerative colitis, unspecified with unspecified complications: Secondary | ICD-10-CM

## 2022-10-05 NOTE — Telephone Encounter (Signed)
Sending note to Dr Para March; also please see 10/02/22 pt message about referral./

## 2022-10-05 NOTE — Telephone Encounter (Signed)
Can her GI referral get moved to a stat referral?  When can she be seen by GI?  If her symptoms are getting worse in the meantime, then let me know.  Thanks.

## 2022-10-05 NOTE — Telephone Encounter (Signed)
Patient returned call to the office, sent patient to access nurse. Please advise, thank you

## 2022-10-05 NOTE — Telephone Encounter (Signed)
Unable to reach pt by phone and left v/m requesting cb [phone]. Sending note to [facility] triage.

## 2022-10-06 NOTE — Telephone Encounter (Signed)
LMTCB

## 2022-10-06 NOTE — Telephone Encounter (Signed)
In order for GI to see patient quicker then a normal schedule the referral has to be put in as urgent. Otherwise they will schedule the patient at the next open appt which could be months away. Referral per coordinator needs to be changed to urgent as stated below.

## 2022-10-06 NOTE — Telephone Encounter (Signed)
Spoke with patient and advised her that an urgent GI referral has been placed and that if she gets worse and can't see them to make an OV here. Patient verbalized understanding and stated that she is stable right now.

## 2022-10-06 NOTE — Telephone Encounter (Signed)
Please check with patient.  If she is worsening in the meantime or if she can't be seen soon by GI, then please see about getting her an interval OV here.  Thanks.

## 2022-10-06 NOTE — Telephone Encounter (Signed)
I put in the new referral.  Please check with patient.  Make sure that if she is getting worse in the meantime or if she still can't be seen by GI soon, then please see about getting her an interval OV here. Thanks.

## 2022-10-07 ENCOUNTER — Encounter: Payer: Self-pay | Admitting: *Deleted

## 2022-10-21 ENCOUNTER — Other Ambulatory Visit (HOSPITAL_COMMUNITY): Payer: Self-pay

## 2022-10-21 ENCOUNTER — Other Ambulatory Visit (HOSPITAL_BASED_OUTPATIENT_CLINIC_OR_DEPARTMENT_OTHER): Payer: Self-pay

## 2022-10-21 DIAGNOSIS — K625 Hemorrhage of anus and rectum: Secondary | ICD-10-CM | POA: Diagnosis not present

## 2022-10-21 DIAGNOSIS — K5190 Ulcerative colitis, unspecified, without complications: Secondary | ICD-10-CM | POA: Diagnosis not present

## 2022-10-21 MED ORDER — CLENPIQ 10-3.5-12 MG-GM -GM/175ML PO SOLN
ORAL | 0 refills | Status: DC
  Filled 2022-10-21: qty 350, 1d supply, fill #0

## 2022-10-22 ENCOUNTER — Other Ambulatory Visit (HOSPITAL_COMMUNITY): Payer: Self-pay

## 2022-10-23 DIAGNOSIS — K5190 Ulcerative colitis, unspecified, without complications: Secondary | ICD-10-CM | POA: Diagnosis not present

## 2022-11-05 DIAGNOSIS — K259 Gastric ulcer, unspecified as acute or chronic, without hemorrhage or perforation: Secondary | ICD-10-CM | POA: Diagnosis not present

## 2022-11-05 DIAGNOSIS — K635 Polyp of colon: Secondary | ICD-10-CM | POA: Diagnosis not present

## 2022-11-05 DIAGNOSIS — K625 Hemorrhage of anus and rectum: Secondary | ICD-10-CM | POA: Diagnosis not present

## 2022-11-05 DIAGNOSIS — K2289 Other specified disease of esophagus: Secondary | ICD-10-CM | POA: Diagnosis not present

## 2022-11-05 DIAGNOSIS — R12 Heartburn: Secondary | ICD-10-CM | POA: Diagnosis not present

## 2022-11-05 DIAGNOSIS — K449 Diaphragmatic hernia without obstruction or gangrene: Secondary | ICD-10-CM | POA: Diagnosis not present

## 2022-11-05 DIAGNOSIS — K5140 Inflammatory polyps of colon without complications: Secondary | ICD-10-CM | POA: Diagnosis not present

## 2022-11-05 DIAGNOSIS — K51311 Ulcerative (chronic) rectosigmoiditis with rectal bleeding: Secondary | ICD-10-CM | POA: Diagnosis not present

## 2022-11-05 DIAGNOSIS — R197 Diarrhea, unspecified: Secondary | ICD-10-CM | POA: Diagnosis not present

## 2022-11-05 DIAGNOSIS — K5190 Ulcerative colitis, unspecified, without complications: Secondary | ICD-10-CM | POA: Diagnosis not present

## 2022-11-05 LAB — HM COLONOSCOPY

## 2022-11-06 ENCOUNTER — Other Ambulatory Visit (HOSPITAL_COMMUNITY): Payer: Self-pay

## 2022-11-06 MED ORDER — PREDNISONE 10 MG PO TABS
10.0000 mg | ORAL_TABLET | Freq: Every day | ORAL | 3 refills | Status: DC
  Filled 2022-11-06: qty 120, 30d supply, fill #0
  Filled 2022-12-14: qty 120, 30d supply, fill #1

## 2022-11-09 DIAGNOSIS — K5190 Ulcerative colitis, unspecified, without complications: Secondary | ICD-10-CM | POA: Diagnosis not present

## 2022-11-18 ENCOUNTER — Encounter: Payer: Self-pay | Admitting: Family Medicine

## 2022-11-26 ENCOUNTER — Other Ambulatory Visit (HOSPITAL_COMMUNITY): Payer: Self-pay

## 2022-12-02 ENCOUNTER — Other Ambulatory Visit: Payer: Self-pay

## 2022-12-02 ENCOUNTER — Other Ambulatory Visit (HOSPITAL_COMMUNITY): Payer: Self-pay

## 2022-12-02 ENCOUNTER — Ambulatory Visit: Payer: Commercial Managed Care - PPO | Admitting: Pharmacist

## 2022-12-02 DIAGNOSIS — Z7189 Other specified counseling: Secondary | ICD-10-CM

## 2022-12-02 MED ORDER — HUMIRA (2 PEN) 40 MG/0.8ML ~~LOC~~ AJKT: AUTO-INJECTOR | SUBCUTANEOUS | 11 refills | Status: DC

## 2022-12-02 MED ORDER — HUMIRA (2 PEN) 80 MG/0.8ML ~~LOC~~ PNKT
PEN_INJECTOR | SUBCUTANEOUS | 0 refills | Status: DC
  Filled 2022-12-02: qty 3, 15d supply, fill #0

## 2022-12-02 MED ORDER — HUMIRA (2 PEN) 40 MG/0.4ML ~~LOC~~ AJKT
AUTO-INJECTOR | SUBCUTANEOUS | 11 refills | Status: DC
  Filled 2022-12-02: qty 2, fill #0

## 2022-12-02 MED ORDER — HUMIRA (2 PEN) 40 MG/0.4ML ~~LOC~~ AJKT: AUTO-INJECTOR | SUBCUTANEOUS | 11 refills | Status: DC

## 2022-12-02 MED ORDER — HUMIRA (2 PEN) 40 MG/0.8ML ~~LOC~~ AJKT: AUTO-INJECTOR | SUBCUTANEOUS | 0 refills | Status: DC

## 2022-12-02 MED ORDER — HUMIRA (2 PEN) 80 MG/0.8ML ~~LOC~~ PNKT
PEN_INJECTOR | SUBCUTANEOUS | 0 refills | Status: DC
  Filled 2022-12-02: qty 3, fill #0

## 2022-12-02 NOTE — Progress Notes (Signed)
  S: Patient presents for review of their specialty medication therapy.  Patient is about to start taking Humira for UC. Patient is managed by Dr. Elnoria Howard  for this.   Adherence: has not started   Efficacy: has not started  Dosing:  Ulcerative colitis: SubQ (may continue aminosalicylates and/or corticosteroids; if necessary, azathioprine, or mercaptopurine may also be continued): Initial: 160 mg (given as four 40 mg injections on day 1 or given as two 40 mg injections per day over 2 consecutive days), then 80 mg 2 weeks later (day 15). Maintenance: 40 mg every other week beginning day 29. Note: Only continue maintenance dose in patients demonstrating clinical remission by 8 weeks (day 57) of therapy.  Dose adjustments: Renal: no dose adjustments (has not been studied) Hepatic: no dose adjustments (has not been studied)  Drug-drug interactions: none identified  Screening: TB test: negative, 02/16/22 Hepatitis: completed   Monitoring: S/sx of infection: none CBC: nl 02/16/22 S/sx of hypersensitivity: none S/sx of malignancy: none S/sx of heart failure: none  Other side effects: none  O:     Lab Results  Component Value Date   WBC 6.4 02/10/2022   HGB 12.2 02/10/2022   HCT 36.6 02/10/2022   MCV 89.5 02/10/2022   PLT 325.0 02/10/2022      Chemistry      Component Value Date/Time   NA 138 02/10/2022 0834   K 4.1 02/10/2022 0834   CL 103 02/10/2022 0834   CO2 29 02/10/2022 0834   BUN 11 02/10/2022 0834   CREATININE 0.71 02/10/2022 0834      Component Value Date/Time   CALCIUM 8.8 02/10/2022 0834   ALKPHOS 45 02/10/2022 0834   AST 12 02/10/2022 0834   ALT 8 02/10/2022 0834   BILITOT 0.3 02/10/2022 0834       A/P: 1. Medication review: Patient is about to start Humira for UC. Reviewed the medication with the patient, including the following: Humira is a TNF blocking agent indicated for ankylosing spondylitis, Crohn's disease, Hidradenitis suppurativa, psoriatic  arthritis, plaque psoriasis, ulcerative colitis, and uveitis. Patient educated on purpose, proper use and potential adverse effects of Humira. Possible adverse effects are increased risk of infections, headache, and injection site reactions. There is the possibility of an increased risk of malignancy but it is not well understood if this increased risk is due to there medication or the disease state. There are rare cases of pancytopenia and aplastic anemia. For SubQ injection at separate sites in the thigh or lower abdomen (avoiding areas within 2 inches of navel); rotate injection sites. May leave at room temperature for ~15 to 30 minutes prior to use; do not remove cap or cover while allowing product to reach room temperature. Do not use if solution is discolored or contains particulate matter. Do not administer to skin which is red, tender, bruised, hard, or that has scars, stretch marks, or psoriasis plaques. Needle cap of the prefilled syringe or needle cover for the adalimumab pen may contain latex. Prefilled pens and syringes are available for use by patients and the full amount of the syringe should be injected (self-administration); the vial is intended for institutional use only. Vials do not contain a preservative; discard unused portion. No recommendations for any changes at this time.  Butch Penny, PharmD, P[place]y [place], CPP Clinical Pharmacist [facility] [phone]

## 2022-12-03 ENCOUNTER — Other Ambulatory Visit: Payer: Self-pay | Admitting: Pharmacist

## 2022-12-03 ENCOUNTER — Other Ambulatory Visit (HOSPITAL_COMMUNITY): Payer: Self-pay

## 2022-12-03 ENCOUNTER — Other Ambulatory Visit: Payer: Self-pay

## 2022-12-03 MED ORDER — HUMIRA (2 PEN) 40 MG/0.8ML ~~LOC~~ AJKT
AUTO-INJECTOR | SUBCUTANEOUS | 0 refills | Status: DC
  Filled 2022-12-03: qty 6, 15d supply, fill #0
  Filled 2022-12-03: qty 6, fill #0
  Filled 2022-12-04: qty 6, 28d supply, fill #0

## 2022-12-03 MED ORDER — HUMIRA (2 PEN) 40 MG/0.8ML ~~LOC~~ AJKT
AUTO-INJECTOR | SUBCUTANEOUS | 11 refills | Status: DC
  Filled 2022-12-03: qty 2, fill #0
  Filled 2022-12-21: qty 2, 28d supply, fill #0
  Filled 2023-01-20: qty 2, 28d supply, fill #1
  Filled 2023-02-18: qty 2, 28d supply, fill #2
  Filled 2023-03-18: qty 2, 28d supply, fill #3
  Filled 2023-04-15 – 2023-04-16 (×2): qty 2, 28d supply, fill #4
  Filled 2023-05-11 – 2023-05-18 (×2): qty 2, 28d supply, fill #5
  Filled 2023-06-09: qty 2, 28d supply, fill #6
  Filled 2023-07-12 (×2): qty 2, 28d supply, fill #7
  Filled 2023-08-10 (×2): qty 2, 28d supply, fill #8
  Filled 2023-09-06: qty 2, 28d supply, fill #9
  Filled 2023-10-04: qty 2, 28d supply, fill #10
  Filled 2023-10-26: qty 2, 28d supply, fill #11

## 2022-12-03 MED ORDER — HUMIRA (2 PEN) 40 MG/0.8ML ~~LOC~~ AJKT
AUTO-INJECTOR | SUBCUTANEOUS | 0 refills | Status: DC
  Filled 2022-12-03: qty 6, fill #0

## 2022-12-04 ENCOUNTER — Other Ambulatory Visit: Payer: Self-pay

## 2022-12-04 ENCOUNTER — Other Ambulatory Visit (HOSPITAL_COMMUNITY): Payer: Self-pay

## 2022-12-15 ENCOUNTER — Other Ambulatory Visit (HOSPITAL_COMMUNITY): Payer: Self-pay

## 2022-12-21 ENCOUNTER — Other Ambulatory Visit (HOSPITAL_COMMUNITY): Payer: Self-pay

## 2022-12-21 ENCOUNTER — Other Ambulatory Visit: Payer: Self-pay

## 2022-12-25 ENCOUNTER — Other Ambulatory Visit (HOSPITAL_COMMUNITY): Payer: Self-pay

## 2023-01-01 ENCOUNTER — Other Ambulatory Visit (HOSPITAL_COMMUNITY): Payer: Self-pay

## 2023-01-05 ENCOUNTER — Other Ambulatory Visit: Payer: Self-pay

## 2023-01-13 DIAGNOSIS — K5190 Ulcerative colitis, unspecified, without complications: Secondary | ICD-10-CM | POA: Diagnosis not present

## 2023-01-16 ENCOUNTER — Encounter (HOSPITAL_COMMUNITY): Payer: Self-pay

## 2023-01-19 ENCOUNTER — Other Ambulatory Visit: Payer: Self-pay

## 2023-01-20 ENCOUNTER — Other Ambulatory Visit: Payer: Self-pay

## 2023-01-20 ENCOUNTER — Other Ambulatory Visit (HOSPITAL_COMMUNITY): Payer: Self-pay | Admitting: Pharmacy Technician

## 2023-01-20 NOTE — Progress Notes (Signed)
Specialty Pharmacy Refill Coordination Note  Tracy Phelps is a 27 y.o. female contacted today regarding refills of specialty medication(s) Adalimumab .  Patient requested [name] at [facility] at [place]  on 01/26/23   Medication will be filled on 01/25/23.

## 2023-01-25 ENCOUNTER — Other Ambulatory Visit (HOSPITAL_COMMUNITY): Payer: Self-pay

## 2023-01-25 ENCOUNTER — Other Ambulatory Visit: Payer: Self-pay

## 2023-02-17 ENCOUNTER — Encounter: Payer: Self-pay | Admitting: Family Medicine

## 2023-02-17 ENCOUNTER — Other Ambulatory Visit: Payer: Self-pay | Admitting: Family Medicine

## 2023-02-17 DIAGNOSIS — Z975 Presence of (intrauterine) contraceptive device: Secondary | ICD-10-CM

## 2023-02-18 ENCOUNTER — Other Ambulatory Visit: Payer: Self-pay

## 2023-02-18 ENCOUNTER — Other Ambulatory Visit (HOSPITAL_COMMUNITY): Payer: Self-pay

## 2023-02-18 NOTE — Progress Notes (Signed)
Specialty Pharmacy Refill Coordination Note  Tracy Phelps is a 27 y.o. female contacted today regarding refills of specialty medication(s) Adalimumab   Patient requested [name] at [facility] at [place] date: 02/22/23   Medication will be filled on 02/22/23.

## 2023-02-24 ENCOUNTER — Other Ambulatory Visit (HOSPITAL_COMMUNITY): Payer: Self-pay

## 2023-03-18 ENCOUNTER — Other Ambulatory Visit: Payer: Self-pay

## 2023-03-18 ENCOUNTER — Other Ambulatory Visit (HOSPITAL_COMMUNITY): Payer: Self-pay

## 2023-03-18 NOTE — Progress Notes (Signed)
Specialty Pharmacy Refill Coordination Note  Zenja Hassler is a 27 y.o. female contacted today regarding refills of specialty medication(s) Adalimumab   Patient requested [name] at [facility] at [place] date: 03/23/23   Medication will be filled on 03/22/23.

## 2023-03-22 ENCOUNTER — Other Ambulatory Visit: Payer: Self-pay | Admitting: Family Medicine

## 2023-03-22 ENCOUNTER — Other Ambulatory Visit: Payer: Self-pay

## 2023-03-23 ENCOUNTER — Other Ambulatory Visit (HOSPITAL_COMMUNITY): Payer: Self-pay

## 2023-03-23 NOTE — Telephone Encounter (Signed)
Last office visit: 02/16/22 Next office visit: nothing scheduled Last refill:  FLUoxetine (PROZAC) 10 MG capsule 02/16/22 270 capsules with 3 refills

## 2023-03-24 ENCOUNTER — Other Ambulatory Visit (HOSPITAL_COMMUNITY): Payer: Self-pay

## 2023-03-24 MED ORDER — FLUOXETINE HCL 10 MG PO CAPS
30.0000 mg | ORAL_CAPSULE | Freq: Every day | ORAL | 1 refills | Status: DC
  Filled 2023-03-24: qty 270, 90d supply, fill #0
  Filled 2023-06-23: qty 270, 90d supply, fill #1

## 2023-04-15 ENCOUNTER — Other Ambulatory Visit: Payer: Self-pay

## 2023-04-16 ENCOUNTER — Other Ambulatory Visit: Payer: Self-pay

## 2023-04-16 NOTE — Progress Notes (Signed)
Specialty Pharmacy Refill Coordination Note  Tracy Phelps is a 27 y.o. female contacted today regarding refills of specialty medication(s) Adalimumab (Humira (2 Pen))   Patient requested Pickup at [facility] at [facility] date: 04/20/23   Medication will be filled on 04/19/23.

## 2023-04-19 ENCOUNTER — Other Ambulatory Visit: Payer: Self-pay

## 2023-05-06 ENCOUNTER — Encounter: Payer: Self-pay | Admitting: Family Medicine

## 2023-05-06 DIAGNOSIS — R221 Localized swelling, mass and lump, neck: Secondary | ICD-10-CM

## 2023-05-11 ENCOUNTER — Other Ambulatory Visit (HOSPITAL_COMMUNITY): Payer: Self-pay

## 2023-05-11 ENCOUNTER — Other Ambulatory Visit (HOSPITAL_COMMUNITY): Payer: Self-pay | Admitting: Pharmacy Technician

## 2023-05-11 NOTE — Progress Notes (Signed)
 Specialty Pharmacy Refill Coordination Note  Tracy Phelps is a 28 y.o. female contacted today regarding refills of specialty medication(s) Adalimumab  (Humira  (2 Pen))   Patient requested Pickup at [facility] at [place] date: 05/20/23   Medication will be filled on 05/19/23.

## 2023-05-17 ENCOUNTER — Ambulatory Visit: Payer: Self-pay | Admitting: Family Medicine

## 2023-05-17 NOTE — Telephone Encounter (Signed)
Copied from CRM [id number]. Topic: Clinical - Red Word Triage >> May 17, 2023 10:16 AM Tracy Phelps wrote: Reason for CRM: Sharp pain in chest when breathing in or sneezing   Chief Complaint: Chest Pain (Inspiration) Symptoms: No other symptoms.  Frequency: Acute, Yesterday Pertinent Negatives: Patient denies shortness of breath, dull, crushing, or aching chest pain.  Disposition: [] ED /[] Urgent Care (no appt availability in office) / [x] Appointment(In office/virtual)/ []  [place] [facility]/ [] Home Care/ [] Refused Recommended Disposition /[] [place] [facility]/ []  Follow-up with PCP Additional Notes: Tracy Phelps is a 28 year old female triaged today for 'sharp,' chest pain on inspiration. The patient reports occurrences with exertion and deep inspiration only. No pain occurs at rest and the pain only lasts for a few seconds. No radiation, no cardiac history, no respiratory history, and no current symptoms. The patient reports the symptoms started yesterday after playing with her family member outside. History of recent, severe RSV infection. No cough for a week per the patient's report. No evident signs of distress at this time. Advised the patient if symptoms were to change or worsen to go to the ER. Appointment made in office for tomorrow morning. Patient agreed and verbalized understanding to all information and education during telephonic triage.     Reason for Disposition  [1] Chest pain lasts < 5 minutes AND [2] NO chest pain or cardiac symptoms (e.g., breathing difficulty, sweating) now  (Exception: Chest pains that last only a few seconds.)  Answer Assessment - Initial Assessment Questions 1. LOCATION: "Where does it hurt?"       Right under the left breast, rib area and right.  2. RADIATION: "Does the pain go anywhere else?" (e.g., into neck, jaw, arms, back)     No radiation  3. ONSET: "When did the chest pain begin?" (Minutes, hours or days)      Since Yesterday  4. PATTERN: "Does  the pain come and go, or has it been constant since it started?"  "Does it get worse with exertion?"      Intermittent  5. DURATION: "How long does it last" (e.g., seconds, minutes, hours)     Seconds with occurrence  6. SEVERITY: "How bad is the pain?"  (e.g., Scale 1-10; mild, moderate, or severe)    - MILD (1-3): doesn't interfere with normal activities     - MODERATE (4-7): interferes with normal activities or awakens from sleep    - SEVERE (8-10): excruciating pain, unable to do any normal activities       5  7. CARDIAC RISK FACTORS: "Do you have any history of heart problems or risk factors for heart disease?" (e.g., angina, prior heart attack; diabetes, high blood pressure, high cholesterol, smoker, or strong family history of heart disease)     No  8. PULMONARY RISK FACTORS: "Do you have any history of lung disease?"  (e.g., blood clots in lung, asthma, emphysema, birth control pills)     No  9. CAUSE: "What do you think is causing the chest pain?"     Unsure  10. OTHER SYMPTOMS: "Do you have any other symptoms?" (e.g., dizziness, nausea, vomiting, sweating, fever, difficulty breathing, cough)       None  11. PREGNANCY: "Is there any chance you are pregnant?" "When was your last menstrual period?"       No, 05-02-2023  Protocols used: Chest Pain-A-AH

## 2023-05-17 NOTE — Telephone Encounter (Signed)
Agree. Thanks

## 2023-05-18 ENCOUNTER — Other Ambulatory Visit: Payer: Self-pay

## 2023-05-18 ENCOUNTER — Other Ambulatory Visit (HOSPITAL_COMMUNITY): Payer: Self-pay

## 2023-05-18 ENCOUNTER — Encounter: Payer: Self-pay | Admitting: Family Medicine

## 2023-05-18 ENCOUNTER — Ambulatory Visit: Payer: Commercial Managed Care - PPO | Admitting: Family Medicine

## 2023-05-18 VITALS — BP 100/72 | HR 77 | Temp 98.4°F | Ht 67.5 in | Wt 169.2 lb

## 2023-05-18 DIAGNOSIS — D849 Immunodeficiency, unspecified: Secondary | ICD-10-CM | POA: Insufficient documentation

## 2023-05-18 DIAGNOSIS — R0781 Pleurodynia: Secondary | ICD-10-CM | POA: Diagnosis not present

## 2023-05-18 MED ORDER — PREDNISONE 20 MG PO TABS
ORAL_TABLET | ORAL | 0 refills | Status: AC
  Filled 2023-05-18: qty 15, 7d supply, fill #0

## 2023-05-18 NOTE — Progress Notes (Signed)
Patient ID: Tracy Phelps, female    DOB: 02/24/1996, 28 y.o.   MRN: [id number]  This visit was conducted in person.  BP 100/72 (BP Location: Left Arm, Patient Position: Sitting, Cuff Size: Normal)   Pulse 77   Temp 98.4 F (36.9 C) (Temporal)   Ht 5' 7.5" (1.715 m)   Wt 169 lb 4 oz (76.8 kg)   LMP 05/02/2023   SpO2 99%   BMI 26.12 kg/m    CC:  Chief Complaint  Patient presents with   Bilateral Rib Pain    Subjective:   HPI: Tracy Phelps is a 28 y.o. female patient of Dr. Lianne Bushy with history of ulcerative colitis presenting on 05/18/2023 for Bilateral Rib Pain    New onset pain in bilateral rib cage x 4 days: describes as sharp pain on deep  inspiration.... bilateral lower rib.  Notes also with coughing and sneezing..  Now at rest feels some lower rib tightness.  Sharp lasts a few seconds at a time.  Started after playing with a family member.    Recent history of viral  infection. ( [place] for  COVID) RSV going atround office. With this had left sided chest wall pain, sharp.. improved.  No cough in last week.  No SOB, no wheeze.   No fever.  No fatigue.    Has take tylenol, ibuprofen 200 mg.. no relief.  No history of asthma or lung disorder.   Has had some relief with icing.   On Humira for ulcerative colitis.  Relevant past medical, surgical, family and social history reviewed and updated as indicated. Interim medical history since our last visit reviewed. Allergies and medications reviewed and updated. Outpatient Medications Prior to Visit  Medication Sig Dispense Refill   adalimumab (HUMIRA, 2 PEN,) 40 MG/0.8ML PNKT pen Use as directed subcutaneously inject 160 mg on day 1, 80 mg on day 15, then 40 mg beginning on day 29 (days supply: 15 days) 6 each 0   FLUoxetine (PROZAC) 10 MG capsule Take 3 capsules (30 mg total) by mouth daily as directed 270 capsule 1   adalimumab (HUMIRA, 2 PEN,) 40 MG/0.8ML AJKT pen Inject as directed  subcutaneously every other week 28 days 2 each 11   predniSONE (DELTASONE) 10 MG tablet Days 1 - 4 Take 4 tablets by mouth daily--  Days 5 - 8 Take 3 tablets daily-- Days 9 - 11 Take 2 tablets daily-- Days 12 - 14 Take 1 tablet daily 37 tablet 0   predniSONE (DELTASONE) 10 MG tablet Take 4 tablets by mouth once a day 120 tablet 3   Sod Picosulfate-Mag Ox-Cit Acd (CLENPIQ) 10-3.5-12 MG-GM -GM/175ML SOLN Use as directed 350 mL 0   triamcinolone cream (KENALOG) 0.1 % Apply topically 2 times daily. 30 g 0   No facility-administered medications prior to visit.     Per HPI unless specifically indicated in ROS section below Review of Systems  Constitutional:  Negative for fatigue and fever.  HENT:  Negative for congestion.   Eyes:  Negative for pain.  Respiratory:  Negative for cough and shortness of breath.   Cardiovascular:  Negative for chest pain, palpitations and leg swelling.  Gastrointestinal:  Negative for abdominal pain.  Genitourinary:  Negative for dysuria and vaginal bleeding.  Musculoskeletal:  Negative for back pain.  Neurological:  Negative for syncope, light-headedness and headaches.  Psychiatric/Behavioral:  Negative for dysphoric mood.    Objective:  BP 100/72 (BP Location: Left Arm, Patient Position: Sitting,  Cuff Size: Normal)   Pulse 77   Temp 98.4 F (36.9 C) (Temporal)   Ht 5' 7.5" (1.715 m)   Wt 169 lb 4 oz (76.8 kg)   LMP 05/02/2023   SpO2 99%   BMI 26.12 kg/m   Wt Readings from Last 3 Encounters:  05/18/23 169 lb 4 oz (76.8 kg)  02/16/22 153 lb (69.4 kg)  02/10/21 145 lb (65.8 kg)      Physical Exam Constitutional:      General: She is not in acute distress.    Appearance: Normal appearance. She is well-developed. She is not ill-appearing or toxic-appearing.  HENT:     Head: Normocephalic.     Right Ear: Hearing, tympanic membrane, ear canal and external ear normal. Tympanic membrane is not erythematous, retracted or bulging.     Left Ear: Hearing,  tympanic membrane, ear canal and external ear normal. Tympanic membrane is not erythematous, retracted or bulging.     Nose: No mucosal edema or rhinorrhea.     Right Sinus: No maxillary sinus tenderness or frontal sinus tenderness.     Left Sinus: No maxillary sinus tenderness or frontal sinus tenderness.     Mouth/Throat:     Pharynx: Uvula midline.  Eyes:     General: Lids are normal. Lids are everted, no foreign bodies appreciated.     Conjunctiva/sclera: Conjunctivae normal.     Pupils: Pupils are equal, round, and reactive to light.  Neck:     Thyroid: No thyroid mass or thyromegaly.     Vascular: No carotid bruit.     Trachea: Trachea normal.  Cardiovascular:     Rate and Rhythm: Normal rate and regular rhythm.     Pulses: Normal pulses.     Heart sounds: Normal heart sounds, S1 normal and S2 normal. No murmur heard.    No friction rub. No gallop.  Pulmonary:     Effort: Pulmonary effort is normal. No tachypnea or respiratory distress.     Breath sounds: Normal breath sounds. No decreased breath sounds, wheezing, rhonchi or rales.  Chest:     Chest wall: Tenderness present.    Abdominal:     General: Bowel sounds are normal.     Palpations: Abdomen is soft.     Tenderness: There is no abdominal tenderness.  Musculoskeletal:     Cervical back: Normal range of motion and neck supple.  Skin:    General: Skin is warm and dry.     Findings: No rash.  Neurological:     Mental Status: She is alert.  Psychiatric:        Mood and Affect: Mood is not anxious or depressed.        Speech: Speech normal.        Behavior: Behavior normal. Behavior is cooperative.        Thought Content: Thought content normal.        Judgment: Judgment normal.       Results for orders placed or performed in visit on 11/18/22  [facility] COLONOSCOPY   Collection Time: 11/05/22 12:50 PM  Result Value Ref Range   [facility] Colonoscopy See Report (in chart) See Report (in chart), Patient Reported     Assessment and Plan  Pleuritic chest pain Assessment & Plan: Acute, chest wall muscle strain versus pleurisy versus fracture rib.  Less likely pericarditis.  Normal lung exam and no sign of underlying current bacterial infection. No sign and symptom on exam and pain not localized to upper  anterior chest: Pericarditis unlikely Patient exposed to RSV but not tested.  RSV not typically associated with pleurisy. Patient with preceding coughing fits, excessive now likely with chest wall strain given increased activity triggering.  Recommend prednisone taper, heat or ice and gentle chest wall stretching. No red flags or indication for x-ray.  Return and ER precautions provided.   Immunocompromised (HCC) Assessment & Plan: Due to Humira   Other orders -     predniSONE; Take 3 tabs by mouth daily x 3 days, then 2 tabs by mouth daily x 2 days then 1 tab by mouth daily x 2 days  Dispense: 15 tablet; Refill: 0    No follow-ups on file.   Kerby Nora, MD

## 2023-05-18 NOTE — Assessment & Plan Note (Signed)
Due to Humira 

## 2023-05-18 NOTE — Assessment & Plan Note (Signed)
Acute, chest wall muscle strain versus pleurisy versus fracture rib.  Less likely pericarditis.  Normal lung exam and no sign of underlying current bacterial infection. No sign and symptom on exam and pain not localized to upper anterior chest: Pericarditis unlikely Patient exposed to RSV but not tested.  RSV not typically associated with pleurisy. Patient with preceding coughing fits, excessive now likely with chest wall strain given increased activity triggering.  Recommend prednisone taper, heat or ice and gentle chest wall stretching. No red flags or indication for x-ray.  Return and ER precautions provided.

## 2023-05-19 ENCOUNTER — Other Ambulatory Visit: Payer: Self-pay

## 2023-05-20 ENCOUNTER — Other Ambulatory Visit (HOSPITAL_COMMUNITY): Payer: Self-pay

## 2023-05-20 DIAGNOSIS — N898 Other specified noninflammatory disorders of vagina: Secondary | ICD-10-CM | POA: Diagnosis not present

## 2023-05-20 DIAGNOSIS — Z113 Encounter for screening for infections with a predominantly sexual mode of transmission: Secondary | ICD-10-CM | POA: Diagnosis not present

## 2023-05-20 MED ORDER — FLUCONAZOLE 150 MG PO TABS
150.0000 mg | ORAL_TABLET | ORAL | 0 refills | Status: AC
  Filled 2023-05-20: qty 2, 2d supply, fill #0

## 2023-05-20 MED ORDER — METRONIDAZOLE 500 MG PO TABS
ORAL_TABLET | ORAL | 0 refills | Status: AC
  Filled 2023-05-20: qty 14, 7d supply, fill #0

## 2023-06-03 ENCOUNTER — Ambulatory Visit (INDEPENDENT_AMBULATORY_CARE_PROVIDER_SITE_OTHER): Payer: Commercial Managed Care - PPO | Admitting: Otolaryngology

## 2023-06-03 ENCOUNTER — Encounter (INDEPENDENT_AMBULATORY_CARE_PROVIDER_SITE_OTHER): Payer: Self-pay | Admitting: Otolaryngology

## 2023-06-03 VITALS — BP 132/90 | HR 91 | Ht 68.5 in | Wt 165.0 lb

## 2023-06-03 DIAGNOSIS — T701XXA Sinus barotrauma, initial encounter: Secondary | ICD-10-CM | POA: Diagnosis not present

## 2023-06-03 DIAGNOSIS — H6992 Unspecified Eustachian tube disorder, left ear: Secondary | ICD-10-CM

## 2023-06-03 NOTE — Progress Notes (Signed)
 ENT CONSULT:  Reason for Consult: lump near left ear x 10 yrs    HPI: Discussed the use of AI scribe software for clinical note transcription with the patient, who gave verbal consent to proceed.  History of Present Illness   Tracy Phelps is a 28 year old female who presents with a lump near left ear lobule.   She has had a lump behind her left ear for approximately ten years. The lump swells and shrinks over time, is generally painless, but occasionally feels slightly tender. It has been evaluated by different doctors, but no interventions have been pursued. An ultrasound performed in 2023 was reported as negative. There have been no infection or drainage around the site.  She experiences significant sinus pain on the left side when flying, described as ear pain that feels like her ear is going to explode. This pain occurs intermittently and not every time she flies. Attempts to relieve the pain by trying to pop her ears have been ineffective. No history of allergies, postnasal drainage, itchy or watery eyes is reported.  She reports experiencing night sweats that come and go, which she thinks might be hormonally related. She has eczema but no other hx of rashes. No weight changes. No lumps anywhere else.   She has a history of sebaceous cysts on her head, with a few still present. Her last complete blood count (CBC) was normal in 2023.      Records Reviewed:     office visit with PCP Dr Cleatus 02/16/22 Tdap 21 Covid prev done.  Flu shot to be done at work.  PNA and shingles not due.  Refer to gyn 2023 Colon cancer screening not due.   Mammogram not due.  Advance directive discussed with patient.  Both parents equally designated if patient were incapacitated. [facility].  Would have HIV and HCV screening done.     Night sweats.  Not every night but a few nights a week.  Going on for about several years.  She has been putting up with it.  She  isn't losing weight.  No cough, no sputum.  She is waking at night, sometimes from sweats but sometimes w/o sweats.  She can get back to sleep well.     Knot behind L jaw for a few years.  Noted in college.  Not ttp.  No other lumps.  Inferior to L ear.    Past Medical History:  Diagnosis Date   Anxiety    Depression    IUD (intrauterine device) in place    Copper  T   Ulcerative colitis (HCC)     History reviewed. No pertinent surgical history.  Family History  Problem Relation Age of Onset   Depression Father    High blood pressure Father    Breast cancer Paternal Grandmother    Ulcerative colitis Neg Hx    Colon cancer Neg Hx     Social History:  reports that she has never smoked. She has never used smokeless tobacco. She reports current alcohol use. She reports that she does not use drugs.  Allergies: No Known Allergies  Medications: I have reviewed the patient's current medications.  The PMH, PSH, Medications, Allergies, and SH were reviewed and updated.  ROS: Constitutional: Negative for fever, weight loss and weight gain. Cardiovascular: Negative for chest pain and dyspnea on exertion. Respiratory: Is not experiencing shortness of breath at rest. Gastrointestinal: Negative for nausea and vomiting. Neurological: Negative for  headaches. Psychiatric: The patient is not nervous/anxious  Blood pressure (!) 132/90, pulse 91, height 5' 8.5 (1.74 m), weight 165 lb (74.8 kg), last menstrual period 05/02/2023, SpO2 99%.  PHYSICAL EXAM:  Exam: General: Well-developed, well-nourished Respiratory Respiratory effort: Equal inspiration and expiration without stridor Cardiovascular Peripheral Vascular: Warm extremities with equal color/perfusion Eyes: No nystagmus with equal extraocular motion bilaterally Neuro/Psych/Balance: Patient oriented to person, place, and time; Appropriate mood and affect; Gait is intact with no imbalance; Cranial nerves I-XII are intact Head and  Face Inspection: Normocephalic and atraumatic without mass or lesion Palpation: Facial skeleton intact without bony stepoffs Salivary Glands: No mass or tenderness Facial Strength: Facial motility symmetric and full bilaterally ENT Pinna: External ear intact and fully developed External canal: Canal is patent with intact skin Tympanic Membrane: Clear and mobile External Nose: No scar or anatomic deformity Internal Nose: Septum is relatively straight on anterior rhinoscopy Lips, Teeth, and gums: Mucosa and teeth intact and viable TMJ: No pain to palpation with full mobility Oral cavity/oropharynx: No erythema or exudate, no lesions present, 1+ tonsils Neck Neck and Trachea: Midline trachea without mass or lesion Thyroid : No mass or nodularity Lymphatics: No lymphadenopathy No palpable cysts or lumps in the area of concern   Procedure: none  Studies Reviewed: CBC w/diff - 02/10/2022 - normal   Neck U/S 03/26/22  FINDINGS: No discrete mass is visualized in the imaged area of concern. The closest correlate may be the left parotid gland. No abnormal blood flow.   IMPRESSION: No discrete mass is visualized in the imaged area of concern. The closest correlate is the left parotid gland.  Assessment/Plan: Encounter Diagnoses  Name Primary?   Dysfunction of left eustachian tube Yes   Aerosinusitis, initial encounter    Assessment and Plan    Sensation of a small lump near left ear lobule  Subjective sensation of a small knot near left ear lobule and angle of the mandible,  present for approximately ten years. Painless, occasionally tender, with no concerning findings from previous evaluation (2023 ultrasound was negative). CBC w diff 2023 was normal. Differential diagnosis includes benign anatomical prominence such as a styloid process or muscle tendon. No palpable masses on exam today. No signs of infection, drainage, or significant changes in size over the course of 10 yrs.  Explained that CT scan and biopsy are unnecessary unless changes occur. Reassured her of the benign nature of her sx. - No further diagnostic imaging or biopsy unless changes occur   Sinus Pain with Air Travel Aerosinusitis  Intermittent sinus pain localized to the left ear/face during air travel, likely due to eustachian tube dysfunction and pressure differential/aerosinusitis. No history of allergies, postnasal drainage, or other sinus-related symptoms. Symptoms managed conservatively. Discussed Afrin nasal spray use for one to two days before flying to alleviate symptoms, with caution against habitual use to prevent rebound congestion. - Recommend Afrin nasal spray for one to two days before flying - Advise against habitual use of Afrin to prevent rebound congestion   Thank you for allowing me to participate in the care of this patient. Please do not hesitate to contact me with any questions or concerns.   Elena Larry, MD Otolaryngology [facility] Phone: [phone] Fax: [phone]    06/03/2023, 9:23 AM

## 2023-06-04 ENCOUNTER — Encounter: Payer: Self-pay | Admitting: Family Medicine

## 2023-06-09 ENCOUNTER — Other Ambulatory Visit: Payer: Self-pay

## 2023-06-09 ENCOUNTER — Encounter (HOSPITAL_COMMUNITY): Payer: Self-pay

## 2023-06-09 NOTE — Progress Notes (Signed)
Specialty Pharmacy Ongoing Clinical Assessment Note  Tracy Phelps is a 28 y.o. female who is being followed by the specialty pharmacy service for RxSp Ulcerative Colitis   Patient's specialty medication(s) reviewed today: Adalimumab (Humira (2 Pen))   Missed doses in the last 4 weeks: 0   Patient/Caregiver did not have any additional questions or concerns.   Therapeutic benefit summary: Patient is achieving benefit   Adverse events/side effects summary: No adverse events/side effects   Patient's therapy is appropriate to: Continue    Goals Addressed             This Visit's Progress    Minimize recurrence of flares       Patient is on track. Patient will maintain adherence         Follow up:  6 months  Bobette Mo Specialty Pharmacist

## 2023-06-09 NOTE — Progress Notes (Signed)
Specialty Pharmacy Refill Coordination Note  Tracy Phelps is a 28 y.o. female contacted today regarding refills of specialty medication(s) Adalimumab (Humira (2 Pen))   Patient requested Pickup at [facility] at [facility] date: 06/16/23   Medication will be filled on 06/15/23. Pending PA. Patient aware.

## 2023-06-10 ENCOUNTER — Other Ambulatory Visit (HOSPITAL_COMMUNITY): Payer: Self-pay

## 2023-06-10 ENCOUNTER — Other Ambulatory Visit: Payer: Self-pay

## 2023-06-10 NOTE — Progress Notes (Signed)
Pharmacy Patient Advocate Encounter   Received notification from Patient Pharmacy that prior authorization for Humira is required/requested.   Insurance verification completed.   The patient is insured through [facility] .   Per test claim: PA required; PA submitted to above mentioned insurance via CoverMyMeds Key/confirmation #/EOC [id number] Status is pending

## 2023-06-11 ENCOUNTER — Other Ambulatory Visit: Payer: Self-pay

## 2023-06-11 NOTE — Progress Notes (Addendum)
Pharmacy Patient Advocate Encounter  Received notification from [facility] that Prior Authorization for Humira has been APPROVED from 06/10/23 to 06/08/24   PA #/Case ID/Reference #: [id number]

## 2023-06-14 ENCOUNTER — Other Ambulatory Visit: Payer: Self-pay | Admitting: Family Medicine

## 2023-06-14 DIAGNOSIS — R221 Localized swelling, mass and lump, neck: Secondary | ICD-10-CM

## 2023-06-15 ENCOUNTER — Other Ambulatory Visit: Payer: Self-pay

## 2023-06-15 ENCOUNTER — Encounter: Payer: Self-pay | Admitting: *Deleted

## 2023-06-17 ENCOUNTER — Other Ambulatory Visit (HOSPITAL_COMMUNITY): Payer: Self-pay

## 2023-06-29 ENCOUNTER — Other Ambulatory Visit (HOSPITAL_BASED_OUTPATIENT_CLINIC_OR_DEPARTMENT_OTHER): Payer: Self-pay

## 2023-06-29 ENCOUNTER — Other Ambulatory Visit (HOSPITAL_COMMUNITY): Payer: Self-pay

## 2023-07-12 ENCOUNTER — Other Ambulatory Visit: Payer: Self-pay

## 2023-07-12 ENCOUNTER — Other Ambulatory Visit (HOSPITAL_COMMUNITY): Payer: Self-pay

## 2023-07-12 NOTE — Progress Notes (Signed)
 Specialty Pharmacy Refill Coordination Note  Tracy Phelps is a 28 y.o. female contacted today regarding refills of specialty medication(s) Adalimumab (Humira (2 Pen))   Patient requested Pickup at [facility] at [facility] date: 07/14/23   Medication will be filled on 07/13/23.

## 2023-08-03 ENCOUNTER — Encounter: Payer: Self-pay | Admitting: Obstetrics & Gynecology

## 2023-08-03 ENCOUNTER — Other Ambulatory Visit (HOSPITAL_COMMUNITY)
Admission: RE | Admit: 2023-08-03 | Discharge: 2023-08-03 | Disposition: A | Discharge status: Home / Self Care | Source: Ambulatory Visit | Attending: Obstetrics & Gynecology | Admitting: Obstetrics & Gynecology

## 2023-08-03 ENCOUNTER — Ambulatory Visit: Payer: Commercial Managed Care - PPO | Admitting: Obstetrics & Gynecology

## 2023-08-03 VITALS — BP 125/86 | HR 68 | Wt 174.0 lb

## 2023-08-03 DIAGNOSIS — Z30433 Encounter for removal and reinsertion of intrauterine contraceptive device: Secondary | ICD-10-CM | POA: Diagnosis not present

## 2023-08-03 DIAGNOSIS — Z01419 Encounter for gynecological examination (general) (routine) without abnormal findings: Secondary | ICD-10-CM

## 2023-08-03 DIAGNOSIS — Z975 Presence of (intrauterine) contraceptive device: Secondary | ICD-10-CM | POA: Insufficient documentation

## 2023-08-03 MED ORDER — PARAGARD INTRAUTERINE COPPER IU IUD
1.0000 | INTRAUTERINE_SYSTEM | Freq: Once | INTRAUTERINE | Status: AC
  Administered 2023-08-03: 1 via INTRAUTERINE

## 2023-08-03 NOTE — Progress Notes (Signed)
 GYNECOLOGY ANNUAL PREVENTATIVE CARE ENCOUNTER NOTE  History:     Tracy Phelps is a 28 y.o. G0P0000 female here to establish care and  for a routine annual gynecologic exam.  Also wants replacement of her Paragard, has been in place for over 10 years.   Denies abnormal vaginal bleeding, discharge, pelvic pain, problems with intercourse or other gynecologic concerns.  She is here with her support person.    Gynecologic History Patient's last menstrual period was 07/27/2023 (approximate). Contraception: Paragard IUD Last Pap: Never had one.   Obstetric History OB History  Gravida Para Term Preterm AB Living  0 0 0 0 0 0  SAB IAB Ectopic Multiple Live Births  0 0 0 0 0    Past Medical History:  Diagnosis Date   Anxiety    Depression    IUD (intrauterine device) in place    Copper T   Ulcerative colitis (HCC)     History reviewed. No pertinent surgical history.  Current Outpatient Medications on File Prior to Visit  Medication Sig Dispense Refill   adalimumab (HUMIRA, 2 PEN,) 40 MG/0.8ML PNKT pen Use as directed subcutaneously inject 160 mg on day 1, 80 mg on day 15, then 40 mg beginning on day 29 (days supply: 15 days) 6 each 0   FLUoxetine (PROZAC) 10 MG capsule Take 3 capsules (30 mg total) by mouth daily as directed 270 capsule 1   adalimumab (HUMIRA, 2 PEN,) 40 MG/0.8ML AJKT pen Inject as directed subcutaneously every other week 28 days (Patient not taking: Reported on 08/03/2023) 2 each 11   fluconazole (DIFLUCAN) 150 MG tablet Take 1 tablet (150 mg total) by mouth, wait 3 days then take 2nd tablet (Patient not taking: Reported on 08/03/2023) 2 tablet 0   metroNIDAZOLE (FLAGYL) 500 MG tablet Take 1 tablet (500 mg total) by mouth in the morning and 1 tablet (500 mg total) in the evening. Do all this for 7 days. (Patient not taking: Reported on 08/03/2023) 14 tablet 0   predniSONE (DELTASONE) 20 MG tablet Take 3 tabs by mouth daily x 3 days, then 2 tabs by mouth daily x 2  days then 1 tab by mouth daily x 2 days (Patient not taking: Reported on 08/03/2023) 15 tablet 0   No current facility-administered medications on file prior to visit.    No Known Allergies  Social History:  reports that she has never smoked. She has never used smokeless tobacco. She reports current alcohol use. She reports that she does not use drugs.  Family History  Problem Relation Age of Onset   Depression Father    High blood pressure Father    Breast cancer Paternal Grandmother    Ulcerative colitis Neg Hx    Colon cancer Neg Hx     The following portions of the patient's history were reviewed and updated as appropriate: allergies, current medications, past family history, past medical history, past social history, past surgical history and problem list.  Review of Systems Pertinent items noted in HPI and remainder of comprehensive ROS otherwise negative.  Physical Exam:  BP 125/86   Pulse 68   Wt 174 lb (78.9 kg)   LMP 07/27/2023 (Approximate)   BMI 26.07 kg/m  CONSTITUTIONAL: Well-developed, well-nourished female in no acute distress.  HENT:  Normocephalic, atraumatic, External right and left ear normal.  EYES: Conjunctivae and EOM are normal. Pupils are equal, round, and reactive to light. No scleral icterus.  NECK: Normal range of motion, supple, no  masses observed. SKIN: Skin is warm and dry. No rash noted. Not diaphoretic. No erythema. No pallor. MUSCULOSKELETAL: Normal range of motion. No tenderness.  No cyanosis, clubbing, or edema. NEUROLOGIC: Alert and oriented to person, place, and time. Normal muscle tone coordination.  PSYCHIATRIC: Normal mood and affect. Normal behavior. Normal judgment and thought content. CARDIOVASCULAR: Normal heart rate noted, regular rhythm RESPIRATORY: Clear to auscultation bilaterally. Effort and breath sounds normal, no problems with respiration noted. BREASTS: Symmetric in size. No masses, tenderness, skin changes, nipple drainage,  or lymphadenopathy bilaterally. Performed in the presence of a chaperone. ABDOMEN: Soft, no distention noted.  No tenderness, rebound or guarding.  PELVIC: Normal appearing external genitalia and urethral meatus; normal appearing vaginal mucosa and cervix.  No abnormal vaginal discharge noted. Paragard strings noted.  Pap smear obtained.  Normal uterine size, no other palpable masses, no uterine or adnexal tenderness.  Performed in the presence of a chaperone.  IUD Removal and Reinsertion  Patient identified, informed consent performed, consent signed.   Chaperone present.   Discussed risks of irregular bleeding, cramping, infection, malpositioning or misplacement of the IUD outside the uterus which may require further procedures. Also discussed >99% contraception efficacy, increased risk of ectopic pregnancy with failure of method.   Emphasized that this did not protect against STIs, condoms recommended during all sexual encounters.  Time out was performed. During pelvic exam, after pap smear, the crevix was cleaned with betadine x 3. A paracervical block was placed on the anterior lip and around the cervicovaginal junction using 10 ml of 2% Lidocaine. Patient had a presyncopal episode after the paracervical block, but recovered within a couple of minutes and wanted to proceed with the IUD placement, she said a similar thing happened during her previous IUD placement.  The strings of the old Paragard IUD were grasped and pulled using ring forceps. The IUD was successfully removed in its entirety, and given to the patient in a specimen cup as per her request. The cervix was cleaned with betadine again and grasped anteriorly with a single tooth tenaculum.  The new Paragard IUD was then placed per manufacturer's recommendations. Strings trimmed to 3 cm. Tenaculum was removed, good hemostasis noted. Patient tolerated procedure well.  Patient was given post-procedure instructions.  Patient was also asked to check  IUD strings periodically and follow up in 4 weeks for IUD check.   Assessment and Plan:     1. Encounter for IUD removal and reinsertion Paragard replaced, can be in place for up to 12 years. Will return for IUD check in 4 weeks. - Paragard intrauterine copper IUD 1 each  2. Well woman exam with routine gynecological exam (Primary) - Cytology - PAP Will follow up results of pap smear and manage accordingly. Normal breast examination today, she was advised to perform periodic self breast examinations.  Routine preventative health maintenance measures emphasized. Please refer to After Visit Summary for other counseling recommendations.      Jaynie Collins, MD, FACOG Obstetrician & Gynecologist, [facility] for [employer], [facility]

## 2023-08-03 NOTE — Patient Instructions (Signed)

## 2023-08-04 ENCOUNTER — Encounter: Payer: Self-pay | Admitting: Obstetrics & Gynecology

## 2023-08-04 LAB — CYTOLOGY - PAP
Comment: NEGATIVE
Diagnosis: NEGATIVE
High risk HPV: NEGATIVE

## 2023-08-05 ENCOUNTER — Other Ambulatory Visit: Payer: Self-pay

## 2023-08-09 ENCOUNTER — Other Ambulatory Visit: Payer: Self-pay

## 2023-08-10 ENCOUNTER — Other Ambulatory Visit (HOSPITAL_COMMUNITY): Payer: Self-pay

## 2023-08-10 ENCOUNTER — Other Ambulatory Visit: Payer: Self-pay

## 2023-08-10 NOTE — Progress Notes (Signed)
 Specialty Pharmacy Refill Coordination Note  Tracy Phelps is a 28 y.o. female contacted today regarding refills of specialty medication(s) Adalimumab (Humira (2 Pen))   Patient requested Pickup at [facility] at [facility] date: 08/12/23   Medication will be filled on 08/11/23.

## 2023-08-11 ENCOUNTER — Other Ambulatory Visit: Payer: Self-pay

## 2023-08-30 ENCOUNTER — Ambulatory Visit: Admitting: Family Medicine

## 2023-08-30 VITALS — BP 115/77 | HR 91

## 2023-08-30 DIAGNOSIS — Z30431 Encounter for routine checking of intrauterine contraceptive device: Secondary | ICD-10-CM

## 2023-08-30 NOTE — Progress Notes (Signed)
   Subjective:    Patient ID: Tracy Phelps is a 28 y.o. female presenting with IUD check   on 08/30/2023  HPI: Here for IUD check. She has no issues. This is her second one.  Review of Systems  Constitutional:  Negative for chills and fever.  Respiratory:  Negative for shortness of breath.   Cardiovascular:  Negative for chest pain.  Gastrointestinal:  Negative for abdominal pain, nausea and vomiting.  Genitourinary:  Negative for dysuria.  Skin:  Negative for rash.      Objective:    BP 115/77   Pulse 91   LMP 07/27/2023 (Approximate)  Physical Exam Exam conducted with a chaperone present.  Constitutional:      General: She is not in acute distress.    Appearance: She is well-developed.  HENT:     Head: Normocephalic and atraumatic.  Eyes:     General: No scleral icterus. Cardiovascular:     Rate and Rhythm: Normal rate.  Pulmonary:     Effort: Pulmonary effort is normal.  Abdominal:     Palpations: Abdomen is soft.  Genitourinary:    Comments: BUS normal, vagina is pink and rugated, cervix is nulliparous without lesion, IUD strings noted  Musculoskeletal:     Cervical back: Neck supple.  Skin:    General: Skin is warm and dry.  Neurological:     Mental Status: She is alert and oriented to person, place, and time.         Assessment & Plan:  IUD check up - In place, may continue as long as needed/desired.  No follow-ups on file.  Granville Layer, MD 08/30/2023 2:02 PM

## 2023-08-31 ENCOUNTER — Ambulatory Visit: Admitting: Obstetrics & Gynecology

## 2023-09-01 ENCOUNTER — Other Ambulatory Visit (HOSPITAL_COMMUNITY): Payer: Self-pay

## 2023-09-06 ENCOUNTER — Other Ambulatory Visit: Payer: Self-pay | Admitting: Pharmacy Technician

## 2023-09-06 ENCOUNTER — Other Ambulatory Visit: Payer: Self-pay

## 2023-09-06 NOTE — Progress Notes (Signed)
 Specialty Pharmacy Refill Coordination Note  Tracy Phelps is a 28 y.o. female contacted today regarding refills of specialty medication(s) Adalimumab  (Humira  (2 Pen))   Patient requested Pickup at [facility] at [place] date: 09/09/23   Medication will be filled on 09/08/23.

## 2023-09-21 ENCOUNTER — Other Ambulatory Visit (HOSPITAL_COMMUNITY): Payer: Self-pay

## 2023-09-21 MED ORDER — FLUOXETINE HCL 10 MG PO CAPS
ORAL_CAPSULE | ORAL | 0 refills | Status: DC
  Filled 2023-09-21: qty 90, 30d supply, fill #0

## 2023-10-04 ENCOUNTER — Other Ambulatory Visit: Payer: Self-pay

## 2023-10-04 ENCOUNTER — Encounter (INDEPENDENT_AMBULATORY_CARE_PROVIDER_SITE_OTHER): Payer: Self-pay

## 2023-10-04 NOTE — Progress Notes (Signed)
 Specialty Pharmacy Refill Coordination Note  Tracy Phelps is a 28 y.o. female contacted today regarding refills of specialty medication(s) Adalimumab  (Humira  (2 Pen))   Patient requested (Patient-Rptd) Pickup at [facility] at [facility] date: (Patient-Rptd) 10/06/23   Medication will be filled on 10/04/23.

## 2023-10-22 ENCOUNTER — Other Ambulatory Visit (HOSPITAL_COMMUNITY): Payer: Self-pay

## 2023-10-26 ENCOUNTER — Other Ambulatory Visit: Payer: Self-pay

## 2023-10-26 ENCOUNTER — Encounter (INDEPENDENT_AMBULATORY_CARE_PROVIDER_SITE_OTHER): Payer: Self-pay

## 2023-10-26 ENCOUNTER — Other Ambulatory Visit (HOSPITAL_COMMUNITY): Payer: Self-pay

## 2023-10-26 NOTE — Progress Notes (Signed)
 Specialty Pharmacy Refill Coordination Note  Tracy Phelps is a 28 y.o. female contacted today regarding refills of specialty medication(s) Adalimumab  (Humira  (2 Pen))   Patient requested (Patient-Rptd) Pickup at [facility] at [facility] date: (Patient-Rptd) 10/28/23   Medication will be filled on 10/27/23.

## 2023-10-28 ENCOUNTER — Other Ambulatory Visit (HOSPITAL_COMMUNITY): Payer: Self-pay

## 2023-11-05 DIAGNOSIS — F331 Major depressive disorder, recurrent, moderate: Secondary | ICD-10-CM | POA: Diagnosis not present

## 2023-11-05 DIAGNOSIS — F413 Other mixed anxiety disorders: Secondary | ICD-10-CM | POA: Diagnosis not present

## 2023-11-06 ENCOUNTER — Other Ambulatory Visit (HOSPITAL_COMMUNITY): Payer: Self-pay

## 2023-11-06 MED ORDER — FLUOXETINE HCL 10 MG PO CAPS
10.0000 mg | ORAL_CAPSULE | Freq: Every day | ORAL | 0 refills | Status: AC
  Filled 2023-11-06: qty 90, 90d supply, fill #0

## 2023-11-25 ENCOUNTER — Other Ambulatory Visit: Payer: Self-pay | Admitting: Internal Medicine

## 2023-11-26 ENCOUNTER — Encounter (INDEPENDENT_AMBULATORY_CARE_PROVIDER_SITE_OTHER): Payer: Self-pay

## 2023-11-26 ENCOUNTER — Other Ambulatory Visit: Payer: Self-pay

## 2023-11-26 NOTE — Progress Notes (Signed)
 Specialty Pharmacy Refill Coordination Note  Tracy Phelps is a 28 y.o. female contacted today regarding refills of specialty medication(s) Adalimumab  (Humira  (2 Pen))   Patient requested (Patient-Rptd) Pickup at [facility] at [facility] date: (Patient-Rptd) 11/30/23   Medication will be filled on 11/29/23, pending refill approval.   Refill request send to original prescriber. Send to Benjamin for rewrite.

## 2023-12-01 ENCOUNTER — Other Ambulatory Visit: Payer: Self-pay

## 2023-12-02 ENCOUNTER — Other Ambulatory Visit (HOSPITAL_COMMUNITY): Payer: Self-pay

## 2023-12-02 ENCOUNTER — Ambulatory Visit: Attending: Family Medicine | Admitting: Pharmacist

## 2023-12-02 ENCOUNTER — Other Ambulatory Visit: Payer: Self-pay | Admitting: Internal Medicine

## 2023-12-02 ENCOUNTER — Other Ambulatory Visit: Payer: Self-pay

## 2023-12-02 DIAGNOSIS — F439 Reaction to severe stress, unspecified: Secondary | ICD-10-CM | POA: Diagnosis not present

## 2023-12-02 DIAGNOSIS — F3342 Major depressive disorder, recurrent, in full remission: Secondary | ICD-10-CM | POA: Diagnosis not present

## 2023-12-02 DIAGNOSIS — Z7189 Other specified counseling: Secondary | ICD-10-CM

## 2023-12-02 DIAGNOSIS — F419 Anxiety disorder, unspecified: Secondary | ICD-10-CM | POA: Diagnosis not present

## 2023-12-02 MED ORDER — HUMIRA (2 PEN) 40 MG/0.8ML ~~LOC~~ AJKT
AUTO-INJECTOR | SUBCUTANEOUS | 11 refills | Status: DC
  Filled 2023-12-02 (×2): qty 2, 28d supply, fill #0

## 2023-12-02 MED ORDER — HUMIRA (2 PEN) 40 MG/0.8ML ~~LOC~~ AJKT
AUTO-INJECTOR | SUBCUTANEOUS | 11 refills | Status: AC
  Filled 2023-12-02: qty 2, 28d supply, fill #0
  Filled 2023-12-27: qty 2, 28d supply, fill #1
  Filled 2024-01-18 (×2): qty 2, 28d supply, fill #2
  Filled 2024-02-10: qty 2, 28d supply, fill #3
  Filled 2024-03-07: qty 2, 28d supply, fill #4
  Filled 2024-04-13 (×2): qty 2, 28d supply, fill #5
  Filled 2024-05-08: qty 2, 28d supply, fill #6

## 2023-12-02 NOTE — Progress Notes (Signed)
  S: Patient presents for review of their specialty medication therapy.  Patient is about to start taking Humira  for UC. Patient is managed by Dr. Rollin  for this.   Adherence: reported   Efficacy: has achieved near complete symptom control  Dosing:  Ulcerative colitis: SubQ (may continue aminosalicylates and/or corticosteroids; if necessary, azathioprine, or mercaptopurine may also be continued): Initial: 160 mg (given as four 40 mg injections on day 1 or given as two 40 mg injections per day over 2 consecutive days), then 80 mg 2 weeks later (day 15). Maintenance: 40 mg every other week beginning day 29. Note: Only continue maintenance dose in patients demonstrating clinical remission by 8 weeks (day 57) of therapy.  Dose adjustments: Renal: no dose adjustments (has not been studied) Hepatic: no dose adjustments (has not been studied)  Drug-drug interactions: none identified  Screening: TB test: negative, 02/16/22 Hepatitis: completed   Monitoring: S/sx of infection: none CBC: nl 02/16/22 S/sx of hypersensitivity: none S/sx of malignancy: none S/sx of heart failure: none  Other side effects: none  O:     Lab Results  Component Value Date   WBC 6.4 02/10/2022   HGB 12.2 02/10/2022   HCT 36.6 02/10/2022   MCV 89.5 02/10/2022   PLT 325.0 02/10/2022      Chemistry      Component Value Date/Time   NA 138 02/10/2022 0834   K 4.1 02/10/2022 0834   CL 103 02/10/2022 0834   CO2 29 02/10/2022 0834   BUN 11 02/10/2022 0834   CREATININE 0.71 02/10/2022 0834      Component Value Date/Time   CALCIUM 8.8 02/10/2022 0834   ALKPHOS 45 02/10/2022 0834   AST 12 02/10/2022 0834   ALT 8 02/10/2022 0834   BILITOT 0.3 02/10/2022 0834       A/P: 1. Medication review: Patient is taking Humira  for UC. Reviewed the medication with the patient, including the following: Humira  is a TNF blocking agent indicated for ankylosing spondylitis, Crohn's disease, Hidradenitis  suppurativa, psoriatic arthritis, plaque psoriasis, ulcerative colitis, and uveitis. Patient educated on purpose, proper use and potential adverse effects of Humira . Possible adverse effects are increased risk of infections, headache, and injection site reactions. There is the possibility of an increased risk of malignancy but it is not well understood if this increased risk is due to there medication or the disease state. There are rare cases of pancytopenia and aplastic anemia. For SubQ injection at separate sites in the thigh or lower abdomen (avoiding areas within 2 inches of navel); rotate injection sites. May leave at room temperature for ~15 to 30 minutes prior to use; do not remove cap or cover while allowing product to reach room temperature. Do not use if solution is discolored or contains particulate matter. Do not administer to skin which is red, tender, bruised, hard, or that has scars, stretch marks, or psoriasis plaques. Needle cap of the prefilled syringe or needle cover for the adalimumab  pen may contain latex. Prefilled pens and syringes are available for use by patients and the full amount of the syringe should be injected (self-administration); the vial is intended for institutional use only. Vials do not contain a preservative; discard unused portion. No recommendations for any changes at this time.  Tracy Phelps, PharmD, Tracy Phelps, CPP Clinical Pharmacist [facility] [phone]

## 2023-12-02 NOTE — Progress Notes (Signed)
 Specialty Pharmacy Refill Coordination Note  Spoke with Jasmain Ahlberg [facility]  Patient requested: Pick up   Pickup date: 12/02/23  Medication will be filled on 12/02/23.    RR has been approved for the correct dose.  Spoke with Nanetta at office, pt is to continue previous dose of 40 mg/0.8mL 12/02/23 11:40am BG

## 2023-12-27 ENCOUNTER — Encounter (INDEPENDENT_AMBULATORY_CARE_PROVIDER_SITE_OTHER): Payer: Self-pay

## 2023-12-28 ENCOUNTER — Other Ambulatory Visit: Payer: Self-pay | Admitting: Pharmacy Technician

## 2023-12-28 ENCOUNTER — Other Ambulatory Visit: Payer: Self-pay

## 2023-12-28 NOTE — Progress Notes (Signed)
 Specialty Pharmacy Refill Coordination Note  Tracy Phelps is a 28 y.o. female contacted today regarding refills of specialty medication(s) Adalimumab  (Humira  (2 Pen))   Patient requested (Patient-Rptd) Pickup at [facility] at [facility] date: (Patient-Rptd) 12/29/23   Medication will be filled on 12/28/23.

## 2024-01-03 DIAGNOSIS — R221 Localized swelling, mass and lump, neck: Secondary | ICD-10-CM | POA: Diagnosis not present

## 2024-01-18 ENCOUNTER — Other Ambulatory Visit: Payer: Self-pay

## 2024-01-18 ENCOUNTER — Encounter (INDEPENDENT_AMBULATORY_CARE_PROVIDER_SITE_OTHER): Payer: Self-pay

## 2024-01-18 NOTE — Progress Notes (Signed)
 Specialty Pharmacy Refill Coordination Note  Tracy Phelps is a 28 y.o. female contacted today regarding refills of specialty medication(s) Adalimumab  (Humira  (2 Pen))   Patient requested (Patient-Rptd) Pickup at [facility] at [facility] date: (Patient-Rptd) 01/20/24   Medication will be filled on 01/19/24.

## 2024-01-19 DIAGNOSIS — K5190 Ulcerative colitis, unspecified, without complications: Secondary | ICD-10-CM | POA: Diagnosis not present

## 2024-01-22 ENCOUNTER — Other Ambulatory Visit (HOSPITAL_BASED_OUTPATIENT_CLINIC_OR_DEPARTMENT_OTHER): Payer: Self-pay | Admitting: Otolaryngology

## 2024-01-22 DIAGNOSIS — R221 Localized swelling, mass and lump, neck: Secondary | ICD-10-CM

## 2024-01-27 DIAGNOSIS — K5190 Ulcerative colitis, unspecified, without complications: Secondary | ICD-10-CM | POA: Diagnosis not present

## 2024-01-31 DIAGNOSIS — F331 Major depressive disorder, recurrent, moderate: Secondary | ICD-10-CM | POA: Diagnosis not present

## 2024-01-31 DIAGNOSIS — F413 Other mixed anxiety disorders: Secondary | ICD-10-CM | POA: Diagnosis not present

## 2024-02-01 ENCOUNTER — Other Ambulatory Visit (HOSPITAL_COMMUNITY): Payer: Self-pay

## 2024-02-01 MED ORDER — FLUOXETINE HCL 20 MG PO CAPS
20.0000 mg | ORAL_CAPSULE | Freq: Every day | ORAL | 0 refills | Status: DC
  Filled 2024-02-01: qty 90, 90d supply, fill #0

## 2024-02-07 ENCOUNTER — Ambulatory Visit (HOSPITAL_BASED_OUTPATIENT_CLINIC_OR_DEPARTMENT_OTHER)
Admission: RE | Admit: 2024-02-07 | Discharge: 2024-02-07 | Disposition: A | Discharge status: Home / Self Care | Source: Ambulatory Visit | Attending: Otolaryngology | Admitting: Otolaryngology

## 2024-02-07 DIAGNOSIS — R221 Localized swelling, mass and lump, neck: Secondary | ICD-10-CM | POA: Insufficient documentation

## 2024-02-07 MED ORDER — IOHEXOL 300 MG/ML  SOLN
100.0000 mL | Freq: Once | INTRAMUSCULAR | Status: AC | PRN
  Administered 2024-02-07: 76 mL via INTRAVENOUS

## 2024-02-08 ENCOUNTER — Encounter: Payer: Self-pay | Admitting: Family Medicine

## 2024-02-10 ENCOUNTER — Encounter (INDEPENDENT_AMBULATORY_CARE_PROVIDER_SITE_OTHER): Payer: Self-pay

## 2024-02-10 ENCOUNTER — Other Ambulatory Visit: Payer: Self-pay

## 2024-02-10 DIAGNOSIS — F331 Major depressive disorder, recurrent, moderate: Secondary | ICD-10-CM | POA: Diagnosis not present

## 2024-02-10 NOTE — Progress Notes (Signed)
 Clinical Intervention Note  Clinical Intervention Notes: Patient reported starting Metamucil and Omega 3 supplement. No DDIs identified with Humira .   Clinical Intervention Outcomes: Prevention of an adverse drug event   Advertising account planner

## 2024-02-10 NOTE — Progress Notes (Signed)
 Specialty Pharmacy Refill Coordination Note  Tracy Phelps is a 28 y.o. female contacted today regarding refills of specialty medication(s) Adalimumab  (Humira  (2 Pen))   Patient requested (Patient-Rptd) Pickup at [facility] date: (Patient-Rptd) 02/15/24   Medication will be filled on 10/20.

## 2024-02-11 ENCOUNTER — Other Ambulatory Visit: Payer: Self-pay

## 2024-02-14 ENCOUNTER — Other Ambulatory Visit: Payer: Self-pay

## 2024-02-16 DIAGNOSIS — F331 Major depressive disorder, recurrent, moderate: Secondary | ICD-10-CM | POA: Diagnosis not present

## 2024-02-29 DIAGNOSIS — F331 Major depressive disorder, recurrent, moderate: Secondary | ICD-10-CM | POA: Diagnosis not present

## 2024-03-01 ENCOUNTER — Other Ambulatory Visit (HOSPITAL_COMMUNITY): Payer: Self-pay | Admitting: Otolaryngology

## 2024-03-01 DIAGNOSIS — R221 Localized swelling, mass and lump, neck: Secondary | ICD-10-CM

## 2024-03-01 NOTE — Progress Notes (Unsigned)
 Karalee Wilkie POUR, MD  Tracy Phelps Approved for an attempt at US  FNA of tiny 6 mm left parotid nodule.  Lesion may not be visible (was not on US  from 2023).  No sedation  HKM       Previous Messages    ----- Message ----- From: Tracy Phelps Sent: 03/01/2024   1:27 PM EST To: Channing Phelps Tracy; Taryn F Rigney, RT; Ir Proc* Subject: US  FINE NEEDLE ASP 1ST LESION                  Procedure :US  FINE NEEDLE ASP 1ST LESION  Reason :Nech Mass Dx: Neck mass [R22.1 (ICD-10-CM)    History :CT SOFT TISSUE NECK W CONTRAST  Provider: Maggie Hussar, MD  Provider contact ;  [phone]

## 2024-03-07 ENCOUNTER — Other Ambulatory Visit: Payer: Self-pay

## 2024-03-07 DIAGNOSIS — F331 Major depressive disorder, recurrent, moderate: Secondary | ICD-10-CM | POA: Diagnosis not present

## 2024-03-07 NOTE — Progress Notes (Signed)
 Specialty Pharmacy Refill Coordination Note  Tracy Phelps is a 28 y.o. female contacted today regarding refills of specialty medication(s) Adalimumab  (Humira  (2 Pen))   Patient requested Delivery   Delivery date: 03/17/24   Verified address: [address]  [place] [address]   Medication will be filled on: 03/16/24

## 2024-03-07 NOTE — Progress Notes (Signed)
 Specialty Pharmacy Ongoing Clinical Assessment Note  Tracy Phelps is a 28 y.o. female who is being followed by the specialty pharmacy service for RxSp Ulcerative Colitis   Patient's specialty medication(s) reviewed today: Adalimumab  (Humira  (2 Pen))   Missed doses in the last 4 weeks: 0   Patient/Caregiver did not have any additional questions or concerns.   Therapeutic benefit summary: Patient is achieving benefit   Adverse events/side effects summary: Experienced adverse events/side effects (injection site reaction, patient takes cetirizine as premedication)   Patient's therapy is appropriate to: Continue    Goals Addressed             This Visit's Progress    Minimize recurrence of flares   On track    Patient is on track. Patient will maintain adherence         Follow up: 12 months  Shaley Leavens M [facility]led[facility] Specialty Pharmacist

## 2024-03-16 ENCOUNTER — Other Ambulatory Visit: Payer: Self-pay

## 2024-03-16 DIAGNOSIS — F331 Major depressive disorder, recurrent, moderate: Secondary | ICD-10-CM | POA: Diagnosis not present

## 2024-03-28 ENCOUNTER — Other Ambulatory Visit (HOSPITAL_COMMUNITY): Payer: Self-pay

## 2024-03-28 DIAGNOSIS — F331 Major depressive disorder, recurrent, moderate: Secondary | ICD-10-CM | POA: Diagnosis not present

## 2024-03-28 DIAGNOSIS — F413 Other mixed anxiety disorders: Secondary | ICD-10-CM | POA: Diagnosis not present

## 2024-03-28 MED ORDER — FLUOXETINE HCL 20 MG PO CAPS
20.0000 mg | ORAL_CAPSULE | Freq: Every day | ORAL | 0 refills | Status: AC
  Filled 2024-03-28 – 2024-05-09 (×4): qty 90, 90d supply, fill #0

## 2024-03-29 ENCOUNTER — Other Ambulatory Visit (HOSPITAL_COMMUNITY): Payer: Self-pay

## 2024-04-04 ENCOUNTER — Other Ambulatory Visit (HOSPITAL_COMMUNITY): Payer: Self-pay | Admitting: Otolaryngology

## 2024-04-04 ENCOUNTER — Ambulatory Visit (HOSPITAL_COMMUNITY)
Admission: RE | Admit: 2024-04-04 | Discharge: 2024-04-04 | Disposition: A | Source: Ambulatory Visit | Attending: Otolaryngology

## 2024-04-04 DIAGNOSIS — R221 Localized swelling, mass and lump, neck: Secondary | ICD-10-CM | POA: Diagnosis not present

## 2024-04-04 DIAGNOSIS — K118 Other diseases of salivary glands: Secondary | ICD-10-CM | POA: Diagnosis not present

## 2024-04-05 DIAGNOSIS — F331 Major depressive disorder, recurrent, moderate: Secondary | ICD-10-CM | POA: Diagnosis not present

## 2024-04-13 ENCOUNTER — Other Ambulatory Visit: Payer: Self-pay

## 2024-04-13 ENCOUNTER — Other Ambulatory Visit (HOSPITAL_COMMUNITY): Payer: Self-pay

## 2024-04-13 ENCOUNTER — Encounter: Payer: Self-pay | Admitting: Family Medicine

## 2024-04-13 DIAGNOSIS — F331 Major depressive disorder, recurrent, moderate: Secondary | ICD-10-CM | POA: Diagnosis not present

## 2024-04-13 NOTE — Progress Notes (Signed)
 Specialty Pharmacy Refill Coordination Note  Tracy Phelps is a 28 y.o. female contacted today regarding refills of specialty medication(s) Adalimumab  (Humira  (2 Pen))   Patient requested Delivery   Delivery date: 04/18/24   Verified address: [address]  [place] [address]   Medication will be filled on: 04/17/24

## 2024-04-24 ENCOUNTER — Other Ambulatory Visit (HOSPITAL_COMMUNITY): Payer: Self-pay

## 2024-04-25 ENCOUNTER — Encounter: Payer: Self-pay | Admitting: Pharmacist

## 2024-04-25 ENCOUNTER — Other Ambulatory Visit: Payer: Self-pay

## 2024-04-26 DIAGNOSIS — F331 Major depressive disorder, recurrent, moderate: Secondary | ICD-10-CM | POA: Diagnosis not present

## 2024-05-01 ENCOUNTER — Other Ambulatory Visit: Payer: Self-pay

## 2024-05-08 ENCOUNTER — Other Ambulatory Visit: Payer: Self-pay

## 2024-05-09 ENCOUNTER — Encounter (HOSPITAL_COMMUNITY): Payer: Self-pay

## 2024-05-09 ENCOUNTER — Other Ambulatory Visit (HOSPITAL_COMMUNITY): Payer: Self-pay

## 2024-05-09 ENCOUNTER — Other Ambulatory Visit: Payer: Self-pay

## 2024-05-09 NOTE — Progress Notes (Signed)
 Specialty Pharmacy Refill Coordination Note  MyChart Questionnaire Submission  Tracy Phelps is a 29 y.o. female contacted today regarding refills of specialty medication(s) Humira .  Doses on hand: (Patient-Rptd) 0   Next inj: (Patient-Rptd) 05/22/24  Patient requested: (Patient-Rptd) Delivery   Delivery date: 05/18/24  Verified address: [address] [place] [address]  Medication will be filled on 05/17/24

## 2024-05-16 ENCOUNTER — Other Ambulatory Visit: Payer: Self-pay

## 2024-05-16 ENCOUNTER — Other Ambulatory Visit (HOSPITAL_COMMUNITY): Payer: Self-pay

## 2024-05-17 ENCOUNTER — Other Ambulatory Visit: Payer: Self-pay

## 2024-05-19 ENCOUNTER — Other Ambulatory Visit (HOSPITAL_COMMUNITY): Payer: Self-pay

## 2024-05-19 MED ORDER — LISDEXAMFETAMINE DIMESYLATE 20 MG PO CAPS
20.0000 mg | ORAL_CAPSULE | Freq: Every morning | ORAL | 0 refills | Status: AC
  Filled 2024-05-19: qty 30, 30d supply, fill #0

## 2024-06-02 ENCOUNTER — Other Ambulatory Visit (HOSPITAL_COMMUNITY): Payer: Self-pay

## 2024-06-02 MED ORDER — KETOCONAZOLE 2 % EX CREA
TOPICAL_CREAM | CUTANEOUS | 11 refills | Status: AC
  Filled 2024-06-02: qty 60, 30d supply, fill #0

## 2024-06-02 MED ORDER — DESONIDE 0.05 % EX CREA
TOPICAL_CREAM | CUTANEOUS | 11 refills | Status: AC
  Filled 2024-06-02: qty 60, 30d supply, fill #0

## 2024-06-02 MED ORDER — KETOCONAZOLE 2 % EX SHAM
MEDICATED_SHAMPOO | CUTANEOUS | 0 refills | Status: AC
  Filled 2024-06-02: qty 120, 30d supply, fill #0
